# Patient Record
Sex: Male | Born: 1960
Health system: Southern US, Community
[De-identification: ages and names within clinical notes are randomized; demographics above are authoritative.]

## PROBLEM LIST (undated history)

## (undated) DIAGNOSIS — F17200 Nicotine dependence, unspecified, uncomplicated: Secondary | ICD-10-CM

## (undated) DIAGNOSIS — R7302 Impaired glucose tolerance (oral): Secondary | ICD-10-CM

## (undated) DIAGNOSIS — E079 Disorder of thyroid, unspecified: Secondary | ICD-10-CM

## (undated) DIAGNOSIS — M2550 Pain in unspecified joint: Secondary | ICD-10-CM

## (undated) DIAGNOSIS — N289 Disorder of kidney and ureter, unspecified: Secondary | ICD-10-CM

## (undated) DIAGNOSIS — E291 Testicular hypofunction: Secondary | ICD-10-CM

## (undated) DIAGNOSIS — I10 Essential (primary) hypertension: Secondary | ICD-10-CM

## (undated) DIAGNOSIS — G4730 Sleep apnea, unspecified: Secondary | ICD-10-CM

## (undated) DIAGNOSIS — E785 Hyperlipidemia, unspecified: Secondary | ICD-10-CM

## (undated) DIAGNOSIS — E039 Hypothyroidism, unspecified: Secondary | ICD-10-CM

## (undated) DIAGNOSIS — M1990 Unspecified osteoarthritis, unspecified site: Secondary | ICD-10-CM

## (undated) DIAGNOSIS — E042 Nontoxic multinodular goiter: Secondary | ICD-10-CM

## (undated) DIAGNOSIS — K219 Gastro-esophageal reflux disease without esophagitis: Secondary | ICD-10-CM

## (undated) DIAGNOSIS — L989 Disorder of the skin and subcutaneous tissue, unspecified: Secondary | ICD-10-CM

## (undated) DIAGNOSIS — N529 Male erectile dysfunction, unspecified: Secondary | ICD-10-CM

## (undated) HISTORY — PX: MEDIAL PARTIAL KNEE REPLACEMENT: SHX5965

## (undated) HISTORY — DX: Pain in unspecified joint: M25.50

## (undated) HISTORY — DX: Impaired glucose tolerance (oral): R73.02

## (undated) HISTORY — DX: Nicotine dependence, unspecified, uncomplicated: F17.200

## (undated) HISTORY — DX: Unspecified osteoarthritis, unspecified site: M19.90

## (undated) HISTORY — DX: Essential (primary) hypertension: I10

## (undated) HISTORY — DX: Disorder of the skin and subcutaneous tissue, unspecified: L98.9

## (undated) HISTORY — DX: Sleep apnea, unspecified: G47.30

## (undated) HISTORY — DX: Nontoxic multinodular goiter: E04.2

## (undated) HISTORY — DX: Disorder of kidney and ureter, unspecified: N28.9

## (undated) HISTORY — DX: Hyperlipidemia, unspecified: E78.5

## (undated) HISTORY — DX: Disorder of thyroid, unspecified: E07.9

## (undated) HISTORY — DX: Hypothyroidism, unspecified: E03.9

## (undated) HISTORY — DX: Gastro-esophageal reflux disease without esophagitis: K21.9

## (undated) HISTORY — DX: Male erectile dysfunction, unspecified: N52.9

## (undated) HISTORY — DX: Testicular hypofunction: E29.1

---
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
## 1999-07-23 HISTORY — PX: CARDIAC CATHETERIZATION: SHX172

## 2001-07-22 HISTORY — PX: CHOLECYSTECTOMY: SHX55

## 2005-01-21 ENCOUNTER — Emergency Department: Payer: Self-pay | Admitting: Emergency Medicine

## 2007-07-23 DIAGNOSIS — E042 Nontoxic multinodular goiter: Secondary | ICD-10-CM

## 2007-07-23 DIAGNOSIS — G4730 Sleep apnea, unspecified: Secondary | ICD-10-CM

## 2007-07-23 HISTORY — DX: Nontoxic multinodular goiter: E04.2

## 2007-07-23 HISTORY — DX: Sleep apnea, unspecified: G47.30

## 2007-07-23 HISTORY — PX: OTHER SURGICAL HISTORY: SHX169

## 2008-02-26 ENCOUNTER — Ambulatory Visit: Payer: Self-pay | Admitting: Family Medicine

## 2008-06-10 ENCOUNTER — Ambulatory Visit: Payer: Self-pay | Admitting: Family Medicine

## 2008-11-04 ENCOUNTER — Ambulatory Visit: Payer: Self-pay | Admitting: Family Medicine

## 2008-12-23 ENCOUNTER — Ambulatory Visit: Payer: Self-pay | Admitting: Otolaryngology

## 2011-08-23 DIAGNOSIS — E291 Testicular hypofunction: Secondary | ICD-10-CM

## 2011-08-23 HISTORY — DX: Testicular hypofunction: E29.1

## 2012-08-01 ENCOUNTER — Other Ambulatory Visit: Payer: Self-pay | Admitting: Family Medicine

## 2012-08-03 ENCOUNTER — Encounter: Payer: Self-pay | Admitting: Family Medicine

## 2012-08-03 ENCOUNTER — Ambulatory Visit (INDEPENDENT_AMBULATORY_CARE_PROVIDER_SITE_OTHER): Payer: 59 | Admitting: Family Medicine

## 2012-08-03 VITALS — BP 146/100 | HR 67 | Temp 98.5°F | Resp 16 | Ht 73.0 in | Wt 218.8 lb

## 2012-08-03 DIAGNOSIS — Z0000 Encounter for general adult medical examination without abnormal findings: Secondary | ICD-10-CM

## 2012-08-03 DIAGNOSIS — E291 Testicular hypofunction: Secondary | ICD-10-CM | POA: Insufficient documentation

## 2012-08-03 DIAGNOSIS — F430 Acute stress reaction: Secondary | ICD-10-CM

## 2012-08-03 DIAGNOSIS — E039 Hypothyroidism, unspecified: Secondary | ICD-10-CM

## 2012-08-03 DIAGNOSIS — E785 Hyperlipidemia, unspecified: Secondary | ICD-10-CM | POA: Insufficient documentation

## 2012-08-03 DIAGNOSIS — I10 Essential (primary) hypertension: Secondary | ICD-10-CM

## 2012-08-03 DIAGNOSIS — K219 Gastro-esophageal reflux disease without esophagitis: Secondary | ICD-10-CM

## 2012-08-03 DIAGNOSIS — N529 Male erectile dysfunction, unspecified: Secondary | ICD-10-CM

## 2012-08-03 LAB — CBC WITH DIFFERENTIAL/PLATELET
Eosinophils Relative: 5 % (ref 0–5)
Lymphocytes Relative: 19 % (ref 12–46)
Lymphs Abs: 1.3 10*3/uL (ref 0.7–4.0)
MCV: 88.7 fL (ref 78.0–100.0)
Neutro Abs: 4.6 10*3/uL (ref 1.7–7.7)
Neutrophils Relative %: 69 % (ref 43–77)
Platelets: 310 10*3/uL (ref 150–400)
RBC: 4.79 MIL/uL (ref 4.22–5.81)
WBC: 6.7 10*3/uL (ref 4.0–10.5)

## 2012-08-03 LAB — COMPREHENSIVE METABOLIC PANEL
Albumin: 4.9 g/dL (ref 3.5–5.2)
Alkaline Phosphatase: 41 U/L (ref 39–117)
CO2: 23 mEq/L (ref 19–32)
Calcium: 9.7 mg/dL (ref 8.4–10.5)
Chloride: 106 mEq/L (ref 96–112)
Glucose, Bld: 105 mg/dL — ABNORMAL HIGH (ref 70–99)
Potassium: 4.3 mEq/L (ref 3.5–5.3)
Sodium: 139 mEq/L (ref 135–145)
Total Protein: 6.9 g/dL (ref 6.0–8.3)

## 2012-08-03 LAB — POCT UA - MICROSCOPIC ONLY
Bacteria, U Microscopic: NEGATIVE
Casts, Ur, LPF, POC: NEGATIVE

## 2012-08-03 LAB — LIPID PANEL
Cholesterol: 154 mg/dL (ref 0–200)
Triglycerides: 113 mg/dL (ref <150)

## 2012-08-03 LAB — POCT URINALYSIS DIPSTICK
Glucose, UA: NEGATIVE
Spec Grav, UA: 1.02
Urobilinogen, UA: 0.2

## 2012-08-03 LAB — VITAMIN B12: Vitamin B-12: 794 pg/mL (ref 211–911)

## 2012-08-03 LAB — IFOBT (OCCULT BLOOD): IFOBT: NEGATIVE

## 2012-08-03 LAB — POCT GLYCOSYLATED HEMOGLOBIN (HGB A1C): Hemoglobin A1C: 5.9

## 2012-08-03 LAB — CK: Total CK: 161 U/L (ref 7–232)

## 2012-08-03 MED ORDER — AMLODIPINE BESYLATE 10 MG PO TABS: 10.0000 mg | ORAL_TABLET | Freq: Every day | ORAL | Status: DC

## 2012-08-03 NOTE — Progress Notes (Signed)
  Subjective:    Patient ID: Francisco Gallagher, male    DOB: 05/30/1961, 52 y.o.   MRN: [id number]  HPI    Review of Systems  Constitutional: Positive for fatigue.  HENT: Positive for tinnitus.   Eyes: Negative.   Respiratory: Negative.   Cardiovascular: Negative.   Gastrointestinal: Negative.   Genitourinary: Negative.   Musculoskeletal: Positive for arthralgias.  Skin: Negative.   Neurological: Negative.   Hematological: Negative.   Psychiatric/Behavioral: Negative.        Objective:   Physical Exam        Assessment & Plan:

## 2012-08-03 NOTE — Progress Notes (Signed)
[address]   [place], [place]  [address]   [phone]  Subjective:    Patient ID: Francisco Gallagher, male    DOB: 04/20/1961, 52 y.o.   MRN: [id number]  HPIThis 51 y.o. male presents to establish care and for CPE.    Last physical 07/2011.   Colonoscopy never. TDAP 01/18/2008. Influenza never. Eye exam 2013; +glasses; no cataracts or glaucoma. Dental exam 2013; overdue.  Other providers:  [facility]; Derm:  [facility].  ENT:  [facility].  HTN:  New onset in 06/2012. Called office and no availabilities so went to [facility].  Started on Amlodopine 5mg  daily on 06/29/12.  Blood pressures remained elevated so returned one week later; Amlodipine increased to 10mg  on 07/06/12.  Blood pressures running 130-140/80s at home.  Major personal stressors; girlfriend diagnosed with breast cancer one year ago; has been undergoing intensive treatment for past year; labs in 06/2012 with recurrent disease.  Given 6-9 months to live.  Patient has been paying for breast cancer treatment; has paid $20,000 thus far.  Mother with gambling problem and writing bad checks; pt covering her debt.  Pt also paying bills.  Not exercising.  Lots of stressors.  Job pays well.  Sleeping 5 hours per night which is patient's baseline.   Review of Systems  Constitutional: Negative for fever, chills, diaphoresis, activity change, appetite change, fatigue and unexpected weight change.  HENT: Positive for tinnitus. Negative for hearing loss, ear pain, nosebleeds, congestion, sore throat, facial swelling, rhinorrhea, sneezing, drooling, mouth sores, trouble swallowing, neck pain, neck stiffness, dental problem, voice change, postnasal drip, sinus pressure and ear discharge.   Eyes: Negative for photophobia, pain, discharge, redness, itching and visual disturbance.  Respiratory: Negative for apnea, cough, choking, chest tightness, shortness of breath, wheezing and stridor.   Cardiovascular: Negative for  chest pain, palpitations and leg swelling.  Gastrointestinal: Negative for nausea, vomiting, abdominal pain, diarrhea, constipation, blood in stool, abdominal distention, anal bleeding and rectal pain.  Genitourinary: Negative for dysuria, urgency, frequency, hematuria, flank pain, decreased urine volume, discharge, penile swelling, scrotal swelling, enuresis, difficulty urinating, genital sores, penile pain and testicular pain.  Musculoskeletal: Positive for myalgias and arthralgias. Negative for back pain, joint swelling and gait problem.  Skin: Positive for color change and wound. Negative for pallor and rash.  Neurological: Negative for dizziness, tremors, seizures, syncope, facial asymmetry, speech difficulty, weakness, light-headedness, numbness and headaches.  Hematological: Negative for adenopathy. Does not bruise/bleed easily.  Psychiatric/Behavioral: Negative for suicidal ideas, hallucinations, behavioral problems, confusion, sleep disturbance, self-injury, dysphoric mood, decreased concentration and agitation. The patient is not nervous/anxious and is not hyperactive.     Past Medical History  Diagnosis Date  . Thyroid disease     per pt low thyroid  . Hyperlipidemia   . Hypertension   . Arthritis   . GERD (gastroesophageal reflux disease)   . Erectile dysfunction     Cialis PRN  . Hypogonadism male 08/23/2011    s/p urology consult [facility]; no treatment indicated due to potential of pregnancy.  . Glucose intolerance (impaired glucose tolerance)   . Renal insufficiency   . Multinodular goiter 07/23/2007    thyroid u/s: multinodular goiter; s/p ENT consult/[facility]:  Rx for Synthroid.    Past Surgical History  Procedure Date  . Cholecystectomy 2003  . Sleep study 07/23/2007  . Cardiac catheterization 07/23/1999    negative.    Prior to Admission medications   Medication Sig Start Date End  Date Taking? Authorizing Provider  amLODipine (NORVASC) 10 MG tablet Take 1  tablet (10 mg total) by mouth daily. 08/03/12  Yes Ethelda Chick, MD  fenofibrate 160 MG tablet TAKE 1 TABLET EVERY DAY 08/01/12   Nelva Nay, PA-C    Allergies  Allergen Reactions  . Penicillins Rash    History   Social History  . Marital Status: Single    Spouse Name: N/A    Number of Children: N/A  . Years of Education: N/A   Occupational History  . Not on file.   Social History Main Topics  . Smoking status: Never Smoker   . Smokeless tobacco: Current User     Comment: PATIENT CHEWS TOBACCO  . 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 Drug Use: No  . Sexually Active: Yes   Other Topics Concern  . Not on file   Social History Narrative   Marital status: single; dating same male x 5 years; girlfriend diagnosed with Breast Cancer 2013.   Children: none   Lives: with parents   Employment:  Works at [employer] in [employer] x 25 years; happy   Tobacco:  Chews tobacco x 23 years   Alcohol:  5 times per year at [employer]   Drugs:  None   Exercise: sporadic   Seatbelt:  50% of time   Guns: loaded secured guns in home.    Sexual activity: sexually active; no STDs; total sexual partners < 10.    Family History  Problem Relation Age of Onset  . Arthritis Mother   . Hypertension Mother   . COPD Mother   . Depression Mother   . Fibromyalgia Mother   . Gout Mother   . Kidney disease Mother   . Hypertension Father   . Benign prostatic hyperplasia Father   . Rosacea Father   . Arthritis Brother        Objective:   Physical Exam  Nursing note and vitals reviewed. Constitutional: He is oriented to person, place, and time. He appears well-developed and well-nourished. No distress.  HENT:  Head: Normocephalic and atraumatic.  Right Ear: External ear normal.  Left Ear: External ear normal.  Nose: Nose normal.  Mouth/Throat: Oropharynx is clear and moist.  Eyes: Conjunctivae normal and EOM are normal. Pupils are equal, round, and reactive  to light.  Neck: Normal range of motion. Neck supple. No JVD present. No thyromegaly present.  Cardiovascular: Normal rate, regular rhythm, normal heart sounds and intact distal pulses.  Exam reveals no gallop and no friction rub.   No murmur heard. Pulmonary/Chest: Effort normal and breath sounds normal. He has no wheezes. He has no rales.  Abdominal: Soft. Bowel sounds are normal. He exhibits no distension and no mass. There is no tenderness. There is no rebound and no guarding. Hernia confirmed negative in the right inguinal area and confirmed negative in the left inguinal area.  Genitourinary: Rectum normal, testes normal and penis normal. Guaiac negative stool. Prostate is enlarged. Prostate is not tender. Right testis shows no mass, no swelling and no tenderness. Left testis shows no mass, no swelling and no tenderness. Circumcised. No penile tenderness.  Musculoskeletal: Normal range of motion. He exhibits no edema.       Right shoulder: Normal.       Left shoulder: Normal.       Cervical back: Normal.  Lymphadenopathy:    He has no cervical adenopathy.  Right: No inguinal adenopathy present.       Left: No inguinal adenopathy present.  Neurological: He is alert and oriented to person, place, and time. No cranial nerve deficit. He exhibits normal muscle tone. Coordination normal.  Skin: Skin is warm and dry. He is not diaphoretic.       Scattered nevi torso; +maculopapular lesion L upper arm.  Psychiatric: He has a normal mood and affect. His behavior is normal. Judgment and thought content normal.   Results for orders placed in visit on 08/03/12  POCT UA - MICROSCOPIC ONLY      Component Value Range   WBC, Ur, HPF, POC neg     RBC, urine, microscopic 0-2     Bacteria, U Microscopic neg     Mucus, UA trace     Epithelial cells, urine per micros 0-1     Crystals, Ur, HPF, POC neg     Casts, Ur, LPF, POC neg     Yeast, UA neg    POCT URINALYSIS DIPSTICK      Component Value  Range   Color, UA yellow     Clarity, UA clear     Glucose, UA neg     Bilirubin, UA neg     Ketones, UA neg     Spec Grav, UA 1.020     Blood, UA trace     pH, UA 7.0     Protein, UA trace     Urobilinogen, UA 0.2     Nitrite, UA neg     Leukocytes, UA Negative    POCT GLYCOSYLATED HEMOGLOBIN (HGB A1C)      Component Value Range   Hemoglobin A1C 5.9    IFOBT (OCCULT BLOOD)      Component Value Range   IFOBT Negative         Assessment & Plan:   1. Routine general medical examination at a health care facility  POCT UA - Microscopic Only, POCT urinalysis dipstick, CBC with Differential, CK, POCT glycosylated hemoglobin (Hb A1C), Lipid panel, TSH, PSA, Vitamin B12, Vitamin D 25 hydroxy, T4, Free, Comprehensive metabolic panel, EKG 12-Lead, IFOBT POC (occult bld, rslt in office)  2. Routine general medical examination at a health care facility Active POCT UA - Microscopic Only, POCT urinalysis dipstick, CBC with Differential, CK, POCT glycosylated hemoglobin (Hb A1C), Lipid panel, TSH, PSA, Vitamin B12, Vitamin D 25 hydroxy, T4, Free, Comprehensive metabolic panel, EKG 12-Lead, IFOBT POC (occult bld, rslt in office)   patient is here for a complete physical need this test added onto the rest of his test  3. Essential hypertension, benign    4. Stress reaction

## 2012-08-03 NOTE — Assessment & Plan Note (Signed)
Stable; continue Cialis PRN.

## 2012-08-03 NOTE — Assessment & Plan Note (Signed)
Controlled; obtain labs.  Continue current medications.

## 2012-08-03 NOTE — Assessment & Plan Note (Signed)
New. Counseling provided; coping well at this time with stressors.

## 2012-08-03 NOTE — Patient Instructions (Addendum)
1. 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 CHECK BLOOD PRESSURE ONCE DAILY.

## 2012-08-03 NOTE — Assessment & Plan Note (Signed)
Controlled; using medication PRN.

## 2012-08-03 NOTE — Assessment & Plan Note (Signed)
New.  Moderate control; no changes to management made at visit.  Obtain u/a, EKG.

## 2012-08-03 NOTE — Assessment & Plan Note (Signed)
Anticipatory guidance --- exercise, stress management.  Immunizations discussed; declined flu vaccine; TDAP UTD.  Obtain labs.  Discussed colon cancer screening; declined referral for colonoscopy; hemosure negative.

## 2012-08-03 NOTE — Assessment & Plan Note (Signed)
Controlled; obtain labs; continue current medications. 

## 2012-08-14 ENCOUNTER — Other Ambulatory Visit: Payer: Self-pay | Admitting: Radiology

## 2012-08-14 MED ORDER — LEVOTHYROXINE SODIUM 100 MCG PO TABS: 100.0000 ug | ORAL_TABLET | Freq: Every day | ORAL | Status: DC

## 2012-08-14 NOTE — Telephone Encounter (Signed)
Pharmacy called about Synthroid, patient was recently here for physical this is sent in for patient

## 2012-08-22 HISTORY — PX: OTHER SURGICAL HISTORY: SHX169

## 2012-08-27 ENCOUNTER — Encounter: Payer: Self-pay | Admitting: *Deleted

## 2012-09-01 ENCOUNTER — Encounter: Payer: Self-pay | Admitting: Family Medicine

## 2012-10-30 ENCOUNTER — Telehealth: Payer: Self-pay

## 2012-10-30 NOTE — Telephone Encounter (Signed)
Leg swelling is a known and common side effect of Norvasc.  Is this new?  It looks like he's been on Norvasc 10 mg for awhile now.  There are certainly plenty of other antihypertensives that we can try.  I would advise him to RTC so we can discuss

## 2012-10-30 NOTE — Telephone Encounter (Signed)
Patient advised.

## 2012-10-30 NOTE — Telephone Encounter (Signed)
Called him to advise, has noticed swelling for past 2 months, he notices a line from his socks, states not severe. He has appt scheduled on 11/11/12 wants to know if okay to wait until this appt.

## 2012-10-30 NOTE — Telephone Encounter (Signed)
Yes, if swelling not severe, OK to wait to discuss at appointment on 11/11/12.  Swelling definitely can be due to Amlodipine.

## 2012-10-30 NOTE — Telephone Encounter (Signed)
Legs swelling, on norvasc please advise.

## 2012-10-30 NOTE — Telephone Encounter (Signed)
PT STATES THE HBP MEDS DR Katrinka Blazing HAVE HIM ON IS MAKING HIS LEGS SWELL AND WANTED HER TO KNOW. DIDN'T KNOW IF SHE MAY WANT TO CHANGE IT PLEASE CALL PT AT [phone]   [facility] ON [address] IN [place]

## 2012-11-11 ENCOUNTER — Encounter: Payer: Self-pay | Admitting: Family Medicine

## 2012-11-11 ENCOUNTER — Ambulatory Visit: Payer: 59

## 2012-11-11 ENCOUNTER — Ambulatory Visit (INDEPENDENT_AMBULATORY_CARE_PROVIDER_SITE_OTHER): Payer: 59 | Admitting: Family Medicine

## 2012-11-11 VITALS — BP 146/100 | HR 65 | Temp 98.2°F | Resp 18 | Wt 217.0 lb

## 2012-11-11 DIAGNOSIS — M25512 Pain in left shoulder: Secondary | ICD-10-CM

## 2012-11-11 DIAGNOSIS — M2550 Pain in unspecified joint: Secondary | ICD-10-CM

## 2012-11-11 DIAGNOSIS — I10 Essential (primary) hypertension: Secondary | ICD-10-CM

## 2012-11-11 DIAGNOSIS — E039 Hypothyroidism, unspecified: Secondary | ICD-10-CM

## 2012-11-11 DIAGNOSIS — M7989 Other specified soft tissue disorders: Secondary | ICD-10-CM

## 2012-11-11 DIAGNOSIS — M25519 Pain in unspecified shoulder: Secondary | ICD-10-CM

## 2012-11-11 LAB — POCT URINALYSIS DIPSTICK
Bilirubin, UA: NEGATIVE
Ketones, UA: NEGATIVE
Leukocytes, UA: NEGATIVE
Spec Grav, UA: 1.02
pH, UA: 7

## 2012-11-11 LAB — CBC
HCT: 40.2 % (ref 39.0–52.0)
Hemoglobin: 14 g/dL (ref 13.0–17.0)
MCHC: 34.8 g/dL (ref 30.0–36.0)
MCV: 87.4 fL (ref 78.0–100.0)

## 2012-11-11 LAB — COMPREHENSIVE METABOLIC PANEL
Albumin: 5 g/dL (ref 3.5–5.2)
Alkaline Phosphatase: 56 U/L (ref 39–117)
BUN: 17 mg/dL (ref 6–23)
Calcium: 9.9 mg/dL (ref 8.4–10.5)
Chloride: 104 mEq/L (ref 96–112)
Creat: 1.14 mg/dL (ref 0.50–1.35)
Glucose, Bld: 90 mg/dL (ref 70–99)
Potassium: 4.3 mEq/L (ref 3.5–5.3)

## 2012-11-11 LAB — POCT UA - MICROSCOPIC ONLY

## 2012-11-11 MED ORDER — MELOXICAM 15 MG PO TABS: 15.0000 mg | ORAL_TABLET | Freq: Every day | ORAL | Status: DC

## 2012-11-11 MED ORDER — AMLODIPINE BESYLATE 10 MG PO TABS: 5.0000 mg | ORAL_TABLET | Freq: Every day | ORAL | Status: DC

## 2012-11-11 MED ORDER — VALSARTAN 80 MG PO TABS: 80.0000 mg | ORAL_TABLET | Freq: Every day | ORAL | Status: DC

## 2012-11-11 NOTE — Patient Instructions (Addendum)
Impingement Syndrome, Rotator Cuff, Bursitis with Rehab Impingement syndrome is a condition that involves inflammation of the tendons of the rotator cuff and the subacromial bursa, that causes pain in the shoulder. The rotator cuff consists of four tendons and muscles that control much of the shoulder and upper arm function. The subacromial bursa is a fluid filled sac that helps reduce friction between the rotator cuff and one of the bones of the shoulder (acromion). Impingement syndrome is usually an overuse injury that causes swelling of the bursa (bursitis), swelling of the tendon (tendonitis), and/or a tear of the tendon (strain). Strains are classified into three categories. Grade 1 strains cause pain, but the tendon is not lengthened. Grade 2 strains include a lengthened ligament, due to the ligament being stretched or partially ruptured. With grade 2 strains there is still function, although the function may be decreased. Grade 3 strains include a complete tear of the tendon or muscle, and function is usually impaired. SYMPTOMS   Pain around the shoulder, often at the outer portion of the upper arm.  Pain that gets worse with shoulder function, especially when reaching overhead or lifting.  Sometimes, aching when not using the arm.  Pain that wakes you up at night.  Sometimes, tenderness, swelling, warmth, or redness over the affected area.  Loss of strength.  Limited motion of the shoulder, especially reaching behind the back (to the back pocket or to unhook bra) or across your body.  Crackling sound (crepitation) when moving the arm.  Biceps tendon pain and inflammation (in the front of the shoulder). Worse when bending the elbow or lifting. CAUSES  Impingement syndrome is often an overuse injury, in which chronic (repetitive) motions cause the tendons or bursa to become inflamed. A strain occurs when a force is paced on the tendon or muscle that is greater than it can withstand.  Common mechanisms of injury include: Stress from sudden increase in duration, frequency, or intensity of training.  Direct hit (trauma) to the shoulder.  Aging, erosion of the tendon with normal use.  Bony bump on shoulder (acromial spur). RISK INCREASES WITH:  Contact sports (football, wrestling, boxing).  Throwing sports (baseball, tennis, volleyball).  Weightlifting and bodybuilding.  Heavy labor.  Previous injury to the rotator cuff, including impingement.  Poor shoulder strength and flexibility.  Failure to warm up properly before activity.  Inadequate protective equipment.  Old age.  Bony bump on shoulder (acromial spur). PREVENTION   Warm up and stretch properly before activity.  Allow for adequate recovery between workouts.  Maintain physical fitness:  Strength, flexibility, and endurance.  Cardiovascular fitness.  Learn and use proper exercise technique. PROGNOSIS  If treated properly, impingement syndrome usually goes away within 6 weeks. Sometimes surgery is required.  RELATED COMPLICATIONS   Longer healing time if not properly treated, or if not given enough time to heal.  Recurring symptoms, that result in a chronic condition.  Shoulder stiffness, frozen shoulder, or loss of motion.  Rotator cuff tendon tear.  Recurring symptoms, especially if activity is resumed too soon, with overuse, with a direct blow, or when using poor technique. TREATMENT  Treatment first involves the use of ice and medicine, to reduce pain and inflammation. The use of strengthening and stretching exercises may help reduce pain with activity. These exercises may be performed at home or with a therapist. If non-surgical treatment is unsuccessful after more than 6 months, surgery may be advised. After surgery and rehabilitation, activity is usually possible in 3 months.  MEDICATION  If pain medicine is needed, nonsteroidal anti-inflammatory medicines (aspirin and  ibuprofen), or other minor pain relievers (acetaminophen), are often advised.  Do not take pain medicine for 7 days before surgery.  Prescription pain relievers may be given, if your caregiver thinks they are needed. Use only as directed and only as much as you need.  Corticosteroid injections may be given by your caregiver. These injections should be reserved for the most serious cases, because they may only be given a certain number of times. HEAT AND COLD  Cold treatment (icing) should be applied for 10 to 15 minutes every 2 to 3 hours for inflammation and pain, and immediately after activity that aggravates your symptoms. Use ice packs or an ice massage.  Heat treatment may be used before performing stretching and strengthening activities prescribed by your caregiver, physical therapist, or athletic trainer. Use a heat pack or a warm water soak. SEEK MEDICAL CARE IF:   Symptoms get worse or do not improve in 4 to 6 weeks, despite treatment.  New, unexplained symptoms develop. (Drugs used in treatment may produce side effects.) EXERCISES  RANGE OF MOTION (ROM) AND STRETCHING EXERCISES - Impingement Syndrome (Rotator Cuff  Tendinitis, Bursitis) These exercises may help you when beginning to rehabilitate your injury. Your symptoms may go away with or without further involvement from your physician, physical therapist or athletic trainer. While completing these exercises, remember:   Restoring tissue flexibility helps normal motion to return to the joints. This allows healthier, less painful movement and activity.  An effective stretch should be held for at least 30 seconds.  A stretch should never be painful. You should only feel a gentle lengthening or release in the stretched tissue. STRETCH  Flexion, Standing  Stand with good posture. With an underhand grip on your right / left hand, and an overhand grip on the opposite hand, grasp a broomstick or cane so that your hands are a little  more than shoulder width apart.  Keeping your right / left elbow straight and shoulder muscles relaxed, push the stick with your opposite hand, to raise your right / left arm in front of your body and then overhead. Raise your arm until you feel a stretch in your right / left shoulder, but before you have increased shoulder pain.  Try to avoid shrugging your right / left shoulder as your arm rises, by keeping your shoulder blade tucked down and toward your mid-back spine. Hold for __________ seconds.  Slowly return to the starting position. Repeat __________ times. Complete this exercise __________ times per day. STRETCH  Abduction, Supine  Lie on your back. With an underhand grip on your right / left hand and an overhand grip on the opposite hand, grasp a broomstick or cane so that your hands are a little more than shoulder width apart.  Keeping your right / left elbow straight and your shoulder muscles relaxed, push the stick with your opposite hand, to raise your right / left arm out to the side of your body and then overhead. Raise your arm until you feel a stretch in your right / left shoulder, but before you have increased shoulder pain.  Try to avoid shrugging your right / left shoulder as your arm rises, by keeping your shoulder blade tucked down and toward your mid-back spine. Hold for __________ seconds.  Slowly return to the starting position. Repeat __________ times. Complete this exercise __________ times per day. ROM  Flexion, Active-Assisted  Lie on your back.  You may bend your knees for comfort.  Grasp a broomstick or cane so your hands are about shoulder width apart. Your right / left hand should grip the end of the stick, so that your hand is positioned "thumbs-up," as if you were about to shake hands.  Using your healthy arm to lead, raise your right / left arm overhead, until you feel a gentle stretch in your shoulder. Hold for __________ seconds.  Use the stick to  assist in returning your right / left arm to its starting position. Repeat __________ times. Complete this exercise __________ times per day.  ROM - Internal Rotation, Supine   Lie on your back on a firm surface. Place your right / left elbow about 60 degrees away from your side. Elevate your elbow with a folded towel, so that the elbow and shoulder are the same height.  Using a broomstick or cane and your strong arm, pull your right / left hand toward your body until you feel a gentle stretch, but no increase in your shoulder pain. Keep your shoulder and elbow in place throughout the exercise.  Hold for __________ seconds. Slowly return to the starting position. Repeat __________ times. Complete this exercise __________ times per day. STRETCH - Internal Rotation  Place your right / left hand behind your back, palm up.  Throw a towel or belt over your opposite shoulder. Grasp the towel with your right / left hand.  While keeping an upright posture, gently pull up on the towel, until you feel a stretch in the front of your right / left shoulder.  Avoid shrugging your right / left shoulder as your arm rises, by keeping your shoulder blade tucked down and toward your mid-back spine.  Hold for __________ seconds. Release the stretch, by lowering your healthy hand. Repeat __________ times. Complete this exercise __________ times per day. ROM - Internal Rotation   Using an underhand grip, grasp a stick behind your back with both hands.  While standing upright with good posture, slide the stick up your back until you feel a mild stretch in the front of your shoulder.  Hold for __________ seconds. Slowly return to your starting position. Repeat __________ times. Complete this exercise __________ times per day.  STRETCH  Posterior Shoulder Capsule   Stand or sit with good posture. Grasp your right / left elbow and draw it across your chest, keeping it at the same height as your  shoulder.  Pull your elbow, so your upper arm comes in closer to your chest. Pull until you feel a gentle stretch in the back of your shoulder.  Hold for __________ seconds. Repeat __________ times. Complete this exercise __________ times per day. STRENGTHENING EXERCISES - Impingement Syndrome (Rotator Cuff Tendinitis, Bursitis) These exercises may help you when beginning to rehabilitate your injury. They may resolve your symptoms with or without further involvement from your physician, physical therapist or athletic trainer. While completing these exercises, remember:  Muscles can gain both the endurance and the strength needed for everyday activities through controlled exercises.  Complete these exercises as instructed by your physician, physical therapist or athletic trainer. Increase the resistance and repetitions only as guided.  You may experience muscle soreness or fatigue, but the pain or discomfort you are trying to eliminate should never worsen during these exercises. If this pain does get worse, stop and make sure you are following the directions exactly. If the pain is still present after adjustments, discontinue the exercise until you can discuss  the trouble with your clinician.  During your recovery, avoid activity or exercises which involve actions that place your injured hand or elbow above your head or behind your back or head. These positions stress the tissues which you are trying to heal. STRENGTH - Scapular Depression and Adduction   With good posture, sit on a firm chair. Support your arms in front of you, with pillows, arm rests, or on a table top. Have your elbows in line with the sides of your body.  Gently draw your shoulder blades down and toward your mid-back spine. Gradually increase the tension, without tensing the muscles along the top of your shoulders and the back of your neck.  Hold for __________ seconds. Slowly release the tension and relax your muscles  completely before starting the next repetition.  After you have practiced this exercise, remove the arm support and complete the exercise in standing as well as sitting position. Repeat __________ times. Complete this exercise __________ times per day.  STRENGTH - Shoulder Abductors, Isometric  With good posture, stand or sit about 4-6 inches from a wall, with your right / left side facing the wall.  Bend your right / left elbow. Gently press your right / left elbow into the wall. Increase the pressure gradually, until you are pressing as hard as you can, without shrugging your shoulder or increasing any shoulder discomfort.  Hold for __________ seconds.  Release the tension slowly. Relax your shoulder muscles completely before you begin the next repetition. Repeat __________ times. Complete this exercise __________ times per day.  STRENGTH - External Rotators, Isometric  Keep your right / left elbow at your side and bend it 90 degrees.  Step into a door frame so that the outside of your right / left wrist can press against the door frame without your upper arm leaving your side.  Gently press your right / left wrist into the door frame, as if you were trying to swing the back of your hand away from your stomach. Gradually increase the tension, until you are pressing as hard as you can, without shrugging your shoulder or increasing any shoulder discomfort.  Hold for __________ seconds.  Release the tension slowly. Relax your shoulder muscles completely before you begin the next repetition. Repeat __________ times. Complete this exercise __________ times per day.  STRENGTH - Supraspinatus   Stand or sit with good posture. Grasp a __________ weight, or an exercise band or tubing, so that your hand is "thumbs-up," like you are shaking hands.  Slowly lift your right / left arm in a "V" away from your thigh, diagonally into the space between your side and straight ahead. Lift your hand to  shoulder height or as far as you can, without increasing any shoulder pain. At first, many people do not lift their hands above shoulder height.  Avoid shrugging your right / left shoulder as your arm rises, by keeping your shoulder blade tucked down and toward your mid-back spine.  Hold for __________ seconds. Control the descent of your hand, as you slowly return to your starting position. Repeat __________ times. Complete this exercise __________ times per day.  STRENGTH - External Rotators  Secure a rubber exercise band or tubing to a fixed object (table, pole) so that it is at the same height as your right / left elbow when you are standing or sitting on a firm surface.  Stand or sit so that the secured exercise band is at your uninjured side.  Bend   your right / left elbow 90 degrees. Place a folded towel or small pillow under your right / left arm, so that your elbow is a few inches away from your side.  Keeping the tension on the exercise band, pull it away from your body, as if pivoting on your elbow. Be sure to keep your body steady, so that the movement is coming only from your rotating shoulder.  Hold for __________ seconds. Release the tension in a controlled manner, as you return to the starting position. Repeat __________ times. Complete this exercise __________ times per day.  STRENGTH - Internal Rotators   Secure a rubber exercise band or tubing to a fixed object (table, pole) so that it is at the same height as your right / left elbow when you are standing or sitting on a firm surface.  Stand or sit so that the secured exercise band is at your right / left side.  Bend your elbow 90 degrees. Place a folded towel or small pillow under your right / left arm so that your elbow is a few inches away from your side.  Keeping the tension on the exercise band, pull it across your body, toward your stomach. Be sure to keep your body steady, so that the movement is coming only from  your rotating shoulder.  Hold for __________ seconds. Release the tension in a controlled manner, as you return to the starting position. Repeat __________ times. Complete this exercise __________ times per day.  STRENGTH - Scapular Protractors, Standing   Stand arms length away from a wall. Place your hands on the wall, keeping your elbows straight.  Begin by dropping your shoulder blades down and toward your mid-back spine.  To strengthen your protractors, keep your shoulder blades down, but slide them forward on your rib cage. It will feel as if you are lifting the back of your rib cage away from the wall. This is a subtle motion and can be challenging to complete. Ask your caregiver for further instruction, if you are not sure you are doing the exercise correctly.  Hold for __________ seconds. Slowly return to the starting position, resting the muscles completely before starting the next repetition. Repeat __________ times. Complete this exercise __________ times per day. STRENGTH - Scapular Protractors, Supine  Lie on your back on a firm surface. Extend your right / left arm straight into the air while holding a __________ weight in your hand.  Keeping your head and back in place, lift your shoulder off the floor.  Hold for __________ seconds. Slowly return to the starting position, and allow your muscles to relax completely before starting the next repetition. Repeat __________ times. Complete this exercise __________ times per day. STRENGTH - Scapular Protractors, Quadruped  Get onto your hands and knees, with your shoulders directly over your hands (or as close as you can be, comfortably).  Keeping your elbows locked, lift the back of your rib cage up into your shoulder blades, so your mid-back rounds out. Keep your neck muscles relaxed.  Hold this position for __________ seconds. Slowly return to the starting position and allow your muscles to relax completely before starting the  next repetition. Repeat __________ times. Complete this exercise __________ times per day.  STRENGTH - Scapular Retractors  Secure a rubber exercise band or tubing to a fixed object (table, pole), so that it is at the height of your shoulders when you are either standing, or sitting on a firm armless chair.  With a   palm down grip, grasp an end of the band in each hand. Straighten your elbows and lift your hands straight in front of you, at shoulder height. Step back, away from the secured end of the band, until it becomes tense.  Squeezing your shoulder blades together, draw your elbows back toward your sides, as you bend them. Keep your upper arms lifted away from your body throughout the exercise.  Hold for __________ seconds. Slowly ease the tension on the band, as you reverse the directions and return to the starting position. Repeat __________ times. Complete this exercise __________ times per day. STRENGTH - Shoulder Extensors   Secure a rubber exercise band or tubing to a fixed object (table, pole) so that it is at the height of your shoulders when you are either standing, or sitting on a firm armless chair.  With a thumbs-up grip, grasp an end of the band in each hand. Straighten your elbows and lift your hands straight in front of you, at shoulder height. Step back, away from the secured end of the band, until it becomes tense.  Squeezing your shoulder blades together, pull your hands down to the sides of your thighs. Do not allow your hands to go behind you.  Hold for __________ seconds. Slowly ease the tension on the band, as you reverse the directions and return to the starting position. Repeat __________ times. Complete this exercise __________ times per day.  STRENGTH - Scapular Retractors and External Rotators   Secure a rubber exercise band or tubing to a fixed object (table, pole) so that it is at the height as your shoulders, when you are either standing, or sitting on a  firm armless chair.  With a palm down grip, grasp an end of the band in each hand. Bend your elbows 90 degrees and lift your elbows to shoulder height, at your sides. Step back, away from the secured end of the band, until it becomes tense.  Squeezing your shoulder blades together, rotate your shoulders so that your upper arms and elbows remain stationary, but your fists travel upward to head height.  Hold for __________ seconds. Slowly ease the tension on the band, as you reverse the directions and return to the starting position. Repeat __________ times. Complete this exercise __________ times per day.  STRENGTH - Scapular Retractors and External Rotators, Rowing   Secure a rubber exercise band or tubing to a fixed object (table, pole) so that it is at the height of your shoulders, when you are either standing, or sitting on a firm armless chair.  With a palm down grip, grasp an end of the band in each hand. Straighten your elbows and lift your hands straight in front of you, at shoulder height. Step back, away from the secured end of the band, until it becomes tense.  Step 1: Squeeze your shoulder blades together. Bending your elbows, draw your hands to your chest, as if you are rowing a boat. At the end of this motion, your hands and elbow should be at shoulder height and your elbows should be out to your sides.  Step 2: Rotate your shoulders, to raise your hands above your head. Your forearms should be vertical and your upper arms should be horizontal.  Hold for __________ seconds. Slowly ease the tension on the band, as you reverse the directions and return to the starting position. Repeat __________ times. Complete this exercise __________ times per day.  STRENGTH  Scapular Depressors  Find a sturdy chair  without wheels, such as a dining room chair.  Keeping your feet on the floor, and your hands on the chair arms, lift your bottom up from the seat, and lock your elbows.  Keeping  your elbows straight, allow gravity to pull your body weight down. Your shoulders will rise toward your ears.  Raise your body against gravity by drawing your shoulder blades down your back, shortening the distance between your shoulders and ears. Although your feet should always maintain contact with the floor, your feet should progressively support less body weight, as you get stronger.  Hold for __________ seconds. In a controlled and slow manner, lower your body weight to begin the next repetition. Repeat __________ times. Complete this exercise __________ times per day.  Document Released: 07/08/2005 Document Revised: 09/30/2011 Document Reviewed: 10/20/2008 [facility] [employer] Patient Information 2013 [place], [place].

## 2012-11-11 NOTE — Progress Notes (Signed)
[address]   [place], [place]  [address]   [phone]  Subjective:    Patient ID: Francisco Gallagher, male    DOB: March 22, 1961, 52 y.o.   MRN: [id number]  HPI This 52 y.o. male presents for evaluation of the following:  1. HTN:  Three month follow-up for elevated blood pressure.  Has been suffering with mild swelling in legs; will see sock marks.  Walking and standing all day at work.  When gets home and elevates feet, swelling down.  BP readings running 126-151/80-93.  Stopped Amlodipine for a few days, blood pressure elevated; leg swelling improved slightly.  Today before visit was 143/93.  Not exercising currently; wakes up at 4:30am; when gets home, exhausted.  Goes to bed at 10:00-12:00; not sleeping enough.  Naps on weekends.   S/p sleep study in 2009; no CPAP.  No weight gain in past few years.   Stress improved.  No orthopnea; +DOE with really exerting self; able to work without SOB.  2. Arthralgias:  Feet, ankles, knees.  Stiffness.  First thing in morning; after work and sits down.  Unable to exercise due to stiffness.  No swelling.  Hands are also stiff; knuckles proximal B hands.  S/p consultation by Dr. Reita Chard in [place].  Feet and ankles hurt the most.  Stand on concrete all day; walks on concrete all day. Fingers and feet are the worse.  Mother with fibromyalgia.  L shoulder hurting again; s/p ortho consult ([facility]); prescribed home exercises.  Pain mild but persistent.  3.  Leg swelling: onset since last visit.  Thinks secondary to Amlodipine.  Denies CP/palp/DOE/orthopnea/SOB.  No new medications.  No change in activity level.    Review of Systems  Constitutional: Negative for fever, chills, diaphoresis and fatigue.  Eyes: Negative for photophobia and visual disturbance.  Respiratory: Positive for shortness of breath. Negative for cough.   Cardiovascular: Positive for leg swelling. Negative for chest pain and palpitations.  Musculoskeletal: Positive for joint swelling and  arthralgias.  Skin: Negative for rash.  Neurological: Negative for dizziness, tremors, seizures, syncope, facial asymmetry, speech difficulty, weakness, light-headedness, numbness and headaches.        Past Medical History  Diagnosis Date  . Thyroid disease     per pt low thyroid  . Hyperlipidemia   . Hypertension   . Arthritis   . GERD (gastroesophageal reflux disease)   . Erectile dysfunction     Cialis PRN  . Hypogonadism male 08/23/2011    s/p urology consult [facility]; no treatment indicated due to potential of pregnancy.  . Glucose intolerance (impaired glucose tolerance)   . Renal insufficiency   . Multinodular goiter 07/23/2007    thyroid u/s: multinodular goiter; s/p ENT consult/[facility]:  Rx for Synthroid.  Marland Kitchen Unspecified hypothyroidism   . Tobacco use disorder   . Pain in joint, site unspecified   . Unspecified disorder of skin and subcutaneous tissue   . Mild sleep apnea     Past Surgical History  Procedure Laterality Date  . Cholecystectomy  2003  . Sleep study  07/23/2007  . Cardiac catheterization  07/23/1999    negative.  Gillermo Murdoch cyst  1981    Prior to Admission medications   Medication Sig Start Date End Date Taking? Authorizing Provider  amLODipine (NORVASC) 10 MG tablet Take 1 tablet (10 mg total) by mouth daily. 08/03/12  Yes Ethelda Chick, MD  fenofibrate 160 MG tablet TAKE 1 TABLET EVERY DAY 08/01/12  Yes  Heather M Marte, PA-C  levothyroxine (SYNTHROID, LEVOTHROID) 100 MCG tablet Take 1 tablet (100 mcg total) by mouth daily. 08/14/12  Yes Eleanore E Egan, PA-C  pantoprazole (PROTONIX) 40 MG tablet Take 40 mg by mouth daily.   Yes Historical Provider, MD  tadalafil (CIALIS) 20 MG tablet Take 20 mg by mouth daily as needed.   Yes Historical Provider, MD  valsartan (DIOVAN) 80 MG tablet Take 1 tablet (80 mg total) by mouth daily. 11/11/12   Ethelda Chick, MD    Allergies  Allergen Reactions  . Penicillins Rash    History   Social History  .  Marital Status: Single    Spouse Name: N/A    Number of Children: 0  . Years of Education: N/A   Occupational History  . 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 Smoking status: Never Smoker   . Smokeless tobacco: Current User     Comment: PATIENT CHEWS TOBACCO  20 years  . 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 Drug Use: No  . Sexually Active: Yes   Other Topics Concern  . Not on file   Social History Narrative   Marital status: single; dating same male x 5 years; girlfriend diagnosed with Breast Cancer 2013.      Children: none      Lives: with parents      Employment:  Works at [employer] in [employer] x 25 years; happy      Tobacco:  Chews tobacco x 23 years      Alcohol:  5 times per year at [employer]      Drugs:  None      Exercise: sporadic      Seatbelt:  50% of time      Guns: loaded secured guns in home.       Sexual activity: sexually active; no STDs; total sexual partners < 10.   Smoke alarm and carbon monoxide detector in the home.   Caffeine use: carbonated beverages, moderate amount.    Family History  Problem Relation Age of Onset  . Arthritis Mother   . Hypertension Mother   . COPD Mother   . Depression Mother   . Fibromyalgia Mother   . Gout Mother   . Kidney disease Mother   . Cancer Mother     lung  . Heart disease Mother   . Hypertension Father   . Benign prostatic hyperplasia Father   . Rosacea Father   . Arthritis Brother   . Heart disease Maternal Grandfather   . Hypertension Paternal Grandmother   . Stroke Paternal Grandfather   . Diabetes      Objective:   Physical Exam  Nursing note and vitals reviewed. Constitutional: He is oriented to person, place, and time. He appears well-developed and well-nourished. No distress.  HENT:  Nose: Nose normal.  Mouth/Throat: Oropharynx is clear and moist.  Eyes: Conjunctivae and EOM are normal. Pupils are equal,  round, and reactive to light.  Neck: Normal range of motion. Neck supple. No thyromegaly present.  Cardiovascular: Normal rate, regular rhythm and normal heart sounds.  Exam reveals no gallop and no friction rub.   No murmur heard. Pulmonary/Chest: Effort normal and breath sounds normal. He has no wheezes. He has no rales.  Musculoskeletal:       Left shoulder: He exhibits  pain. He exhibits normal range of motion, no tenderness, no bony tenderness, no spasm, normal pulse and normal strength.       Left hand: He exhibits bony tenderness. He exhibits normal range of motion, no tenderness, normal capillary refill, no deformity and no laceration.  L SHOULDER: PAIN WITH ELEVATION> 90 DEGREES; CROSS OVER POSITIVE; EMPTY CAN NEGATIVE;MOTOR 5/5.  GRIP 5/5. B HANDS: JOINT SWELLING/DEFORMITY PIP, DIP JOINTS SCATTERED AND THROUGHOUT.    Lymphadenopathy:    He has no cervical adenopathy.  Neurological: He is alert and oriented to person, place, and time.  Skin: He is not diaphoretic.  Psychiatric: He has a normal mood and affect. His behavior is normal. Judgment and thought content normal.   Results for orders placed in visit on 11/11/12  POCT URINALYSIS DIPSTICK      Result Value Range   Color, UA yellow     Clarity, UA clear     Glucose, UA neg     Bilirubin, UA neg     Ketones, UA neg     Spec Grav, UA 1.020     Blood, UA trace     pH, UA 7.0     Protein, UA trace     Urobilinogen, UA 1.0     Nitrite, UA neg     Leukocytes, UA Negative      [facility] reading (PRIMARY) by  Dr. Katrinka Blazing.  R HAND:  DEGENERATIVE CHANGES DIP REGIONS.  L SHOULDER: NAD.      Assessment & Plan:  Essential hypertension, benign - Plan: POCT urinalysis dipstick, CBC, Comprehensive metabolic panel, TSH, valsartan (DIOVAN) 80 MG tablet  Unspecified hypothyroidism - Plan: POCT urinalysis dipstick, CBC, Comprehensive metabolic panel, TSH  Pain in shoulder, left - Plan: DG Shoulder Left  Arthralgia - Plan: DG Hand 2 View  Right   1.  HTN: worsening; add Valsartan 80mg  daily; decrease Amlodipine to 10mg  1/2 tablet daily; follow-up in two months; check blood pressure daily. 2.  Pain L shoulder: recurrent; s/p xray in office; rx for Mobic 15mg  provided to take daily for two weeks and then PRN.  Home exercise program provided to perform daily.  Avoid heavy lifting or excessive use of L shoulder. 3. Arthralgias:  New.  B feet, ankles, hands, shoulders.  Obtain labs; obtain hand film R.  Rx for Mobic provided.   4. Leg swelling: New. Very mild in office.  May be secondary to Amlodipine; decrease Amlodipine to 10mg  1/2 tablet daily; obtain labs to rule out secondary causes of swelling.  Meds ordered this encounter  Medications  . valsartan (DIOVAN) 80 MG tablet    Sig: Take 1 tablet (80 mg total) by mouth daily.    Dispense:  30 tablet    Refill:  5  . meloxicam (MOBIC) 15 MG tablet    Sig: Take 1 tablet (15 mg total) by mouth daily.    Dispense:  30 tablet    Refill:  2  . amLODipine (NORVASC) 10 MG tablet    Sig: Take 0.5 tablets (5 mg total) by mouth daily.    Dispense:  30 tablet    Refill:  5

## 2012-11-12 ENCOUNTER — Telehealth: Payer: Self-pay

## 2012-11-12 LAB — RHEUMATOID FACTOR: Rhuematoid fact SerPl-aCnc: <10 IU/mL (ref <=14)

## 2012-11-12 LAB — ANA: Anti Nuclear Antibody(ANA): NEGATIVE

## 2012-11-12 MED ORDER — IRBESARTAN 150 MG PO TABS: 150.0000 mg | ORAL_TABLET | Freq: Every day | ORAL | Status: DC

## 2012-11-12 NOTE — Telephone Encounter (Signed)
Received notice from pharmacy that Diovan is not covered by ins w/out PA, and requesting change to losartan or irbesartan if appropriate. Dr Katrinka Blazing authorized change to irbesartan 150mg  QD. Sending in new Rx.  Notified pt of change. Pt agreed.

## 2012-11-19 ENCOUNTER — Other Ambulatory Visit: Payer: Self-pay | Admitting: Family Medicine

## 2013-01-20 ENCOUNTER — Ambulatory Visit: Payer: 59 | Admitting: Family Medicine

## 2013-02-03 ENCOUNTER — Ambulatory Visit (INDEPENDENT_AMBULATORY_CARE_PROVIDER_SITE_OTHER): Payer: 59 | Admitting: Family Medicine

## 2013-02-03 ENCOUNTER — Encounter: Payer: Self-pay | Admitting: Family Medicine

## 2013-02-03 VITALS — BP 154/102 | HR 63 | Temp 98.6°F | Resp 16 | Ht 73.0 in | Wt 213.8 lb

## 2013-02-03 DIAGNOSIS — I10 Essential (primary) hypertension: Secondary | ICD-10-CM

## 2013-02-03 LAB — BASIC METABOLIC PANEL
BUN: 14 mg/dL (ref 6–23)
CO2: 24 mEq/L (ref 19–32)
Chloride: 105 mEq/L (ref 96–112)
Glucose, Bld: 91 mg/dL (ref 70–99)
Potassium: 4.2 mEq/L (ref 3.5–5.3)
Sodium: 141 mEq/L (ref 135–145)

## 2013-02-03 MED ORDER — IRBESARTAN 300 MG PO TABS: 300.0000 mg | ORAL_TABLET | Freq: Every day | ORAL | Status: DC

## 2013-02-03 NOTE — Progress Notes (Signed)
[address]   [place], [place]  [address]   [phone]  Subjective:    Patient ID: Francisco Gallagher, male    DOB: July 20, 1961, 52 y.o.   MRN: [id number]  HPI This 52 y.o. male presents for evaluation of HTN.  At last visit, decreased Amlodipine to 5mg  daily and started Irbesartan 150mg  one daily.  BP at work running 122/82.  BP running good at work.  BP at work this morning was 120s/80s. Home BP cuff running higher 150s/90s. Not taking Amlodipine any longer; only taking Irbesartan 150mg  daily.  Edema resolved with stopping Amlodipine.  Review of Systems  Constitutional: Negative for fever, chills, diaphoresis and fatigue.  Eyes: Negative for photophobia and visual disturbance.  Respiratory: Negative for shortness of breath, wheezing and stridor.   Cardiovascular: Negative for chest pain, palpitations and leg swelling.  Neurological: Negative for dizziness, tremors, seizures, syncope, facial asymmetry, speech difficulty, weakness, light-headedness, numbness and headaches.   Past Medical History  Diagnosis Date  . Thyroid disease     per pt low thyroid  . Hyperlipidemia   . Hypertension   . Arthritis   . GERD (gastroesophageal reflux disease)   . Erectile dysfunction     Cialis PRN  . Hypogonadism male 08/23/2011    s/p urology consult [facility]; no treatment indicated due to potential of pregnancy.  . Glucose intolerance (impaired glucose tolerance)   . Renal insufficiency   . Multinodular goiter 07/23/2007    thyroid u/s: multinodular goiter; s/p ENT consult/[facility]:  Rx for Synthroid.  Marland Kitchen Unspecified hypothyroidism   . Tobacco use disorder   . Pain in joint, site unspecified   . Unspecified disorder of skin and subcutaneous tissue   . Mild sleep apnea    Past Surgical History  Procedure Laterality Date  . Cholecystectomy  2003  . Sleep study  07/23/2007  . Cardiac catheterization  07/23/1999    negative.  Gillermo Murdoch cyst  1981   Allergies  Allergen Reactions  .  Penicillins Rash   Current Outpatient Prescriptions on File Prior to Visit  Medication Sig Dispense Refill  . fenofibrate 160 MG tablet TAKE 1 TABLET EVERY DAY  30 tablet  0  . levothyroxine (SYNTHROID, LEVOTHROID) 100 MCG tablet TAKE 1 TABLET (100 MCG TOTAL) BY MOUTH DAILY.  90 tablet  1  . meloxicam (MOBIC) 15 MG tablet Take 1 tablet (15 mg total) by mouth daily.  30 tablet  2  . pantoprazole (PROTONIX) 40 MG tablet Take 40 mg by mouth daily.      . tadalafil (CIALIS) 20 MG tablet Take 20 mg by mouth daily as needed.      Marland Kitchen amLODipine (NORVASC) 10 MG tablet Take 0.5 tablets (5 mg total) by mouth daily.  30 tablet  5  . valsartan (DIOVAN) 80 MG tablet Take 1 tablet (80 mg total) by mouth daily.  30 tablet  5   No current facility-administered medications on file prior to visit.       Objective:   Physical Exam  Nursing note and vitals reviewed. Constitutional: He is oriented to person, place, and time. He appears well-developed and well-nourished.  HENT:  Head: Normocephalic and atraumatic.  Eyes: Conjunctivae and EOM are normal. Pupils are equal, round, and reactive to light.  Neck: Normal range of motion. Neck supple. No thyromegaly present.  Cardiovascular: Normal rate, regular rhythm, normal heart sounds and intact distal pulses.  Exam reveals no gallop and no friction rub.   No murmur heard.  No LE edema.  Pulmonary/Chest: Effort normal and breath sounds normal. He has no wheezes. He has no rales.  Lymphadenopathy:    He has no cervical adenopathy.  Neurological: He is alert and oriented to person, place, and time. No cranial nerve deficit. He exhibits normal muscle tone. Coordination normal.  Psychiatric: He has a normal mood and affect. His behavior is normal.       Assessment & Plan:  Essential hypertension, benign - Plan: Basic metabolic panel, irbesartan (AVAPRO) 300 MG tablet  1. HTN: uncontrolled; increase Avapro to 300mg  daily; obtain labs. 2. LE edema: resolved  with stopping Amlodipine.  Meds ordered this encounter  Medications  . irbesartan (AVAPRO) 300 MG tablet    Sig: Take 1 tablet (300 mg total) by mouth daily.    Dispense:  30 tablet    Refill:  5

## 2013-04-14 ENCOUNTER — Ambulatory Visit (INDEPENDENT_AMBULATORY_CARE_PROVIDER_SITE_OTHER): Payer: 59 | Admitting: Family Medicine

## 2013-04-14 ENCOUNTER — Encounter: Payer: Self-pay | Admitting: Family Medicine

## 2013-04-14 VITALS — BP 172/100 | HR 59 | Temp 98.4°F | Resp 16 | Ht 72.5 in | Wt 217.8 lb

## 2013-04-14 DIAGNOSIS — R7309 Other abnormal glucose: Secondary | ICD-10-CM

## 2013-04-14 DIAGNOSIS — E782 Mixed hyperlipidemia: Secondary | ICD-10-CM

## 2013-04-14 DIAGNOSIS — I10 Essential (primary) hypertension: Secondary | ICD-10-CM

## 2013-04-14 LAB — LIPID PANEL
LDL Cholesterol: 103 mg/dL — ABNORMAL HIGH (ref 0–99)
Triglycerides: 203 mg/dL — ABNORMAL HIGH (ref <150)
VLDL: 41 mg/dL — ABNORMAL HIGH (ref 0–40)

## 2013-04-14 LAB — CBC WITH DIFFERENTIAL/PLATELET
Basophils Absolute: 0 10*3/uL (ref 0.0–0.1)
Basophils Relative: 1 % (ref 0–1)
Eosinophils Absolute: 0.4 10*3/uL (ref 0.0–0.7)
Eosinophils Relative: 5 % (ref 0–5)
HCT: 42 % (ref 39.0–52.0)
Lymphocytes Relative: 23 % (ref 12–46)
MCH: 31.1 pg (ref 26.0–34.0)
MCHC: 35.2 g/dL (ref 30.0–36.0)
MCV: 88.2 fL (ref 78.0–100.0)
Monocytes Absolute: 0.5 10*3/uL (ref 0.1–1.0)
RDW: 13.1 % (ref 11.5–15.5)

## 2013-04-14 LAB — COMPREHENSIVE METABOLIC PANEL
ALT: 25 U/L (ref 0–53)
AST: 20 U/L (ref 0–37)
Albumin: 4.9 g/dL (ref 3.5–5.2)
CO2: 27 mEq/L (ref 19–32)
Calcium: 9.9 mg/dL (ref 8.4–10.5)
Chloride: 104 mEq/L (ref 96–112)
Creat: 0.98 mg/dL (ref 0.50–1.35)
Potassium: 4.2 mEq/L (ref 3.5–5.3)
Sodium: 138 mEq/L (ref 135–145)
Total Protein: 7.4 g/dL (ref 6.0–8.3)

## 2013-04-14 LAB — HEMOGLOBIN A1C: Hgb A1c MFr Bld: 6.1 % — ABNORMAL HIGH (ref <5.7)

## 2013-04-14 MED ORDER — IRBESARTAN 300 MG PO TABS: 300.0000 mg | ORAL_TABLET | Freq: Every day | ORAL | Status: DC

## 2013-04-14 MED ORDER — HYDROCHLOROTHIAZIDE 12.5 MG PO TABS: 12.5000 mg | ORAL_TABLET | Freq: Every day | ORAL | Status: DC

## 2013-04-14 MED ORDER — MELOXICAM 15 MG PO TABS: 15.0000 mg | ORAL_TABLET | Freq: Every day | ORAL | Status: DC

## 2013-04-14 MED ORDER — LEVOTHYROXINE SODIUM 100 MCG PO TABS: 100.0000 ug | ORAL_TABLET | Freq: Every day | ORAL | Status: DC

## 2013-04-14 MED ORDER — PANTOPRAZOLE SODIUM 40 MG PO TBEC: 40.0000 mg | DELAYED_RELEASE_TABLET | Freq: Every day | ORAL | Status: DC

## 2013-04-14 MED ORDER — FENOFIBRATE 160 MG PO TABS: 160.0000 mg | ORAL_TABLET | Freq: Every day | ORAL | Status: DC

## 2013-04-14 NOTE — Progress Notes (Signed)
[address]   [place], [place]  [address]   [phone]  Subjective:    Patient ID: Francisco Gallagher, male    DOB: 07/23/1960, 52 y.o.   MRN: [id number]  HPI This 52 y.o. male presents for evaluation of HTN.  Home BP running 135-140/78-80.  Management at last viist included increasing AVapro to 300mg  daily.  Reports good compliance with medication; good tolerance to medication; good symptom control.  2.  Glucose intolerance:  Stable; continues to eat sweets regularly.  Due for repeat labs.  3.  Hyperlipidemia: six month follow-up; no changes to management made at last visit; reports good compliance with medication; good tolerance to medication; good symptom control.  Review of Systems  Constitutional: Negative for fever, chills, diaphoresis and fatigue.  Eyes: Negative for visual disturbance.  Respiratory: Negative for cough and shortness of breath.   Cardiovascular: Negative for chest pain, palpitations and leg swelling.  Endocrine: Negative for cold intolerance, heat intolerance, polydipsia, polyphagia and polyuria.  Neurological: Negative for dizziness, tremors, seizures, syncope, weakness, light-headedness, numbness and headaches.   Past Medical History  Diagnosis Date  . Thyroid disease     per pt low thyroid  . Hyperlipidemia   . Hypertension   . Arthritis   . GERD (gastroesophageal reflux disease)   . Erectile dysfunction     Cialis PRN  . Hypogonadism male 08/23/2011    s/p urology consult [facility]; no treatment indicated due to potential of pregnancy.  . Glucose intolerance (impaired glucose tolerance)   . Renal insufficiency   . Multinodular goiter 07/23/2007    thyroid u/s: multinodular goiter; s/p ENT consult/[facility]:  Rx for Synthroid.  Marland Kitchen Unspecified hypothyroidism   . Tobacco use disorder   . Pain in joint, site unspecified   . Unspecified disorder of skin and subcutaneous tissue   . Mild sleep apnea    Past Surgical History  Procedure Laterality Date  .  Cholecystectomy  2003  . Sleep study  07/23/2007  . Cardiac catheterization  07/23/1999    negative.  Gillermo Murdoch cyst  1981   Allergies  Allergen Reactions  . Penicillins Rash   Current Outpatient Prescriptions on File Prior to Visit  Medication Sig Dispense Refill  . tadalafil (CIALIS) 20 MG tablet Take 20 mg by mouth daily as needed.       No current facility-administered medications on file prior to visit.   History   Social History  . Marital Status: Single    Spouse Name: N/A    Number of Children: 0  . Years of Education: N/A   Occupational History  . 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 Smoking status: Never Smoker   . Smokeless tobacco: Current User     Comment: PATIENT CHEWS TOBACCO  20 years  . 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 Drug Use: No  . Sexual Activity: Yes   Other Topics Concern  . Not on file   Social History Narrative   Marital status: single; dating same male x 5 years; girlfriend diagnosed with Breast Cancer 2013.      Children: none      Lives: with parents      Employment:  Works at [employer] in [employer] x 25 years; happy      Tobacco:  Chews tobacco x 23 years  Alcohol:  5 times per year at rock concerts      Drugs:  None      Exercise: sporadic      Seatbelt:  50% of time      Guns: loaded secured guns in home.       Sexual activity: sexually active; no STDs; total sexual partners < 10.   Smoke alarm and carbon monoxide detector in the home.   Caffeine use: carbonated beverages, moderate amount.   Family History  Problem Relation Age of Onset  . Arthritis Mother   . Hypertension Mother   . COPD Mother   . Depression Mother   . Fibromyalgia Mother   . Gout Mother   . Kidney disease Mother   . Cancer Mother     lung  . Heart disease Mother   . Hypertension Father   . Benign prostatic hyperplasia Father   . Rosacea Father   . Arthritis Brother    . Heart disease Maternal Grandfather   . Hypertension Paternal Grandmother   . Stroke Paternal Grandfather   . Diabetes         Objective:   Physical Exam  Nursing note and vitals reviewed. Constitutional: He is oriented to person, place, and time. He appears well-developed and well-nourished. No distress.  HENT:  Head: Normocephalic and atraumatic.  Mouth/Throat: Oropharynx is clear and moist.  Eyes: Conjunctivae and EOM are normal. Pupils are equal, round, and reactive to light.  Neck: Normal range of motion. Neck supple. No JVD present. No thyromegaly present.  Cardiovascular: Normal rate, regular rhythm and normal heart sounds.  Exam reveals no gallop.   No murmur heard. Pulmonary/Chest: Effort normal and breath sounds normal. He has no wheezes. He has no rales.  Lymphadenopathy:    He has no cervical adenopathy.  Neurological: He is alert and oriented to person, place, and time. No cranial nerve deficit. He exhibits normal muscle tone. Coordination normal.  Skin: Skin is warm and dry. No rash noted. He is not diaphoretic.  Psychiatric: He has a normal mood and affect. His behavior is normal.       Assessment & Plan:  Essential hypertension, benign - Plan: Comprehensive metabolic panel, irbesartan (AVAPRO) 300 MG tablet  Other abnormal glucose - Plan: Hemoglobin A1c  Mixed hyperlipidemia - Plan: CBC with Differential, CK, Comprehensive metabolic panel, Lipid panel  1. HTN: moderately controlled; add HCTZ 12.5mg  daily to Avapro 300mg  daily; obtain labs. 2.  Glucose intolerance: controlled with dietary modifications; obtain labs. 3.  Hyperlipidemia: controlled moderately; obtain labs; continue current medications.  Meds ordered this encounter  Medications  . hydrochlorothiazide (HYDRODIURIL) 12.5 MG tablet    Sig: Take 1 tablet (12.5 mg total) by mouth daily.    Dispense:  90 tablet    Refill:  1  . fenofibrate 160 MG tablet    Sig: Take 1 tablet (160 mg total) by  mouth daily.    Dispense:  90 tablet    Refill:  3  . irbesartan (AVAPRO) 300 MG tablet    Sig: Take 1 tablet (300 mg total) by mouth daily.    Dispense:  90 tablet    Refill:  3  . levothyroxine (SYNTHROID, LEVOTHROID) 100 MCG tablet    Sig: Take 1 tablet (100 mcg total) by mouth daily before breakfast.    Dispense:  90 tablet    Refill:  3  . meloxicam (MOBIC) 15 MG tablet    Sig: Take 1 tablet (15 mg total) by  mouth daily.    Dispense:  90 tablet    Refill:  1  . pantoprazole (PROTONIX) 40 MG tablet    Sig: Take 1 tablet (40 mg total) by mouth daily.    Dispense:  90 tablet    Refill:  3   Nilda Simmer, M.D.  [facility] [address] [place], [place]  [address] [phone] phone [phone] fax

## 2013-04-14 NOTE — Patient Instructions (Addendum)
Raynaud's syndrome

## 2013-05-17 ENCOUNTER — Other Ambulatory Visit: Payer: Self-pay | Admitting: Family Medicine

## 2013-08-16 ENCOUNTER — Ambulatory Visit (INDEPENDENT_AMBULATORY_CARE_PROVIDER_SITE_OTHER): Payer: 59 | Admitting: Family Medicine

## 2013-08-16 ENCOUNTER — Encounter: Payer: Self-pay | Admitting: Family Medicine

## 2013-08-16 VITALS — BP 150/100 | HR 50 | Temp 98.3°F | Resp 16 | Ht 73.0 in | Wt 219.0 lb

## 2013-08-16 DIAGNOSIS — E291 Testicular hypofunction: Secondary | ICD-10-CM

## 2013-08-16 DIAGNOSIS — Z0000 Encounter for general adult medical examination without abnormal findings: Secondary | ICD-10-CM

## 2013-08-16 DIAGNOSIS — Z7251 High risk heterosexual behavior: Secondary | ICD-10-CM | POA: Insufficient documentation

## 2013-08-16 DIAGNOSIS — I10 Essential (primary) hypertension: Secondary | ICD-10-CM

## 2013-08-16 DIAGNOSIS — Z1211 Encounter for screening for malignant neoplasm of colon: Secondary | ICD-10-CM

## 2013-08-16 DIAGNOSIS — F430 Acute stress reaction: Secondary | ICD-10-CM

## 2013-08-16 DIAGNOSIS — E785 Hyperlipidemia, unspecified: Secondary | ICD-10-CM

## 2013-08-16 DIAGNOSIS — N529 Male erectile dysfunction, unspecified: Secondary | ICD-10-CM

## 2013-08-16 DIAGNOSIS — E039 Hypothyroidism, unspecified: Secondary | ICD-10-CM

## 2013-08-16 DIAGNOSIS — Z125 Encounter for screening for malignant neoplasm of prostate: Secondary | ICD-10-CM | POA: Insufficient documentation

## 2013-08-16 DIAGNOSIS — K219 Gastro-esophageal reflux disease without esophagitis: Secondary | ICD-10-CM

## 2013-08-16 LAB — COMPLETE METABOLIC PANEL WITH GFR
ALT: 27 U/L (ref 0–53)
AST: 21 U/L (ref 0–37)
Albumin: 4.9 g/dL (ref 3.5–5.2)
Alkaline Phosphatase: 33 U/L — ABNORMAL LOW (ref 39–117)
BILIRUBIN TOTAL: 0.8 mg/dL (ref 0.3–1.2)
BUN: 20 mg/dL (ref 6–23)
CO2: 24 mEq/L (ref 19–32)
CREATININE: 1.36 mg/dL — AB (ref 0.50–1.35)
Calcium: 9.5 mg/dL (ref 8.4–10.5)
Chloride: 105 mEq/L (ref 96–112)
GFR, EST NON AFRICAN AMERICAN: 60 mL/min
GFR, Est African American: 69 mL/min
Glucose, Bld: 106 mg/dL — ABNORMAL HIGH (ref 70–99)
Potassium: 4.5 mEq/L (ref 3.5–5.3)
Sodium: 137 mEq/L (ref 135–145)
Total Protein: 7.1 g/dL (ref 6.0–8.3)

## 2013-08-16 LAB — LIPID PANEL
CHOLESTEROL: 146 mg/dL (ref 0–200)
HDL: 29 mg/dL — ABNORMAL LOW (ref >39)
LDL Cholesterol: 94 mg/dL (ref 0–99)
Total CHOL/HDL Ratio: 5 Ratio
Triglycerides: 114 mg/dL (ref <150)
VLDL: 23 mg/dL (ref 0–40)

## 2013-08-16 LAB — CBC WITH DIFFERENTIAL/PLATELET
Basophils Absolute: 0.1 10*3/uL (ref 0.0–0.1)
Basophils Relative: 1 % (ref 0–1)
EOS ABS: 0.5 10*3/uL (ref 0.0–0.7)
EOS PCT: 8 % — AB (ref 0–5)
HEMATOCRIT: 41.7 % (ref 39.0–52.0)
Hemoglobin: 14.5 g/dL (ref 13.0–17.0)
Lymphocytes Relative: 20 % (ref 12–46)
Lymphs Abs: 1.1 10*3/uL (ref 0.7–4.0)
MCH: 31.7 pg (ref 26.0–34.0)
MCHC: 34.8 g/dL (ref 30.0–36.0)
MCV: 91 fL (ref 78.0–100.0)
MONO ABS: 0.5 10*3/uL (ref 0.1–1.0)
Monocytes Relative: 8 % (ref 3–12)
Neutro Abs: 3.5 10*3/uL (ref 1.7–7.7)
Neutrophils Relative %: 63 % (ref 43–77)
PLATELETS: 260 10*3/uL (ref 150–400)
RBC: 4.58 MIL/uL (ref 4.22–5.81)
RDW: 13 % (ref 11.5–15.5)
WBC: 5.6 10*3/uL (ref 4.0–10.5)

## 2013-08-16 LAB — IFOBT (OCCULT BLOOD): IFOBT: NEGATIVE

## 2013-08-16 LAB — POCT URINALYSIS DIPSTICK
BILIRUBIN UA: NEGATIVE
Glucose, UA: NEGATIVE
Ketones, UA: NEGATIVE
LEUKOCYTES UA: NEGATIVE
NITRITE UA: NEGATIVE
RBC UA: NEGATIVE
Spec Grav, UA: 1.02
Urobilinogen, UA: 1
pH, UA: 6

## 2013-08-16 LAB — PSA: PSA: 2.59 ng/mL (ref <=4.00)

## 2013-08-16 LAB — T4, FREE: Free T4: 1.17 ng/dL (ref 0.80–1.80)

## 2013-08-16 LAB — TSH: TSH: 2.206 u[IU]/mL (ref 0.350–4.500)

## 2013-08-16 LAB — RPR

## 2013-08-16 LAB — HEMOGLOBIN A1C
Hgb A1c MFr Bld: 6 % — ABNORMAL HIGH (ref <5.7)
MEAN PLASMA GLUCOSE: 126 mg/dL — AB (ref <117)

## 2013-08-16 LAB — HIV ANTIBODY (ROUTINE TESTING W REFLEX): HIV: NONREACTIVE

## 2013-08-16 MED ORDER — IRBESARTAN 300 MG PO TABS: 300.0000 mg | ORAL_TABLET | Freq: Every day | ORAL | Status: DC

## 2013-08-16 MED ORDER — LEVOTHYROXINE SODIUM 100 MCG PO TABS: 100.0000 ug | ORAL_TABLET | Freq: Every day | ORAL | Status: DC

## 2013-08-16 MED ORDER — PANTOPRAZOLE SODIUM 40 MG PO TBEC: 40.0000 mg | DELAYED_RELEASE_TABLET | Freq: Every day | ORAL | Status: DC

## 2013-08-16 MED ORDER — FENOFIBRATE 160 MG PO TABS: 160.0000 mg | ORAL_TABLET | Freq: Every day | ORAL | Status: DC

## 2013-08-16 MED ORDER — MELOXICAM 15 MG PO TABS: 15.0000 mg | ORAL_TABLET | Freq: Every day | ORAL | Status: DC

## 2013-08-16 MED ORDER — TADALAFIL 20 MG PO TABS: 20.0000 mg | ORAL_TABLET | Freq: Every day | ORAL | Status: DC | PRN

## 2013-08-16 NOTE — Assessment & Plan Note (Signed)
Anticipatory guidance; obtain RPR, HIV; obtain uriprobe next visit.

## 2013-08-16 NOTE — Assessment & Plan Note (Signed)
Refused colonoscopy due to high deductible; hemosure obtained; asymptomatic.

## 2013-08-16 NOTE — Assessment & Plan Note (Signed)
Completed; s/p DRE; obtain PSA.

## 2013-08-16 NOTE — Assessment & Plan Note (Signed)
Stable; non-compliant with HCTZ due to increased urination; stable home readings.

## 2013-08-16 NOTE — Assessment & Plan Note (Signed)
Stable; coping well with death of girlfriend.

## 2013-08-16 NOTE — Progress Notes (Signed)
Subjective:    Patient ID: Francisco Gallagher, male    DOB: 12-16-1960, 53 y.o.   MRN: [id number]  HPI This 53 y.o. male presents for Complete Physical Examination.  Last physical 08/03/2012. TDAP 01/18/2008. Influenza never; refuses. Colonoscopy never; hemosure negative in 07/2012.  Has high deductible.   Eye exam 05/2013; +glasses; [place]. Dental exam [facility] 2011.   HTN: added HCTZ 12.5mg  one tablet daily at visit 04/14/13.  Does not take every morning.  Increased urination.  Home BP 130/75.    Chest cold: onset 07/21/13.  Improving.  Took OTC Robitussin DM.  Still with sinus congestion but getting better.  Review of Systems  Constitutional: Negative.   HENT: Positive for congestion, sinus pressure and tinnitus. Negative for ear pain, postnasal drip, rhinorrhea, sore throat, trouble swallowing and voice change.   Eyes: Negative.   Respiratory: Positive for cough. Negative for shortness of breath, wheezing and stridor.   Cardiovascular: Negative for chest pain, palpitations and leg swelling.  Gastrointestinal: Negative.   Endocrine: Negative.   Genitourinary: Negative for dysuria, urgency, frequency, hematuria, flank pain, discharge, penile swelling, scrotal swelling, enuresis, genital sores, penile pain and testicular pain.  Musculoskeletal: Positive for arthralgias and myalgias. Negative for back pain, gait problem, joint swelling, neck pain and neck stiffness.  Skin: Negative.   Neurological: Negative.   Hematological: Negative.   Psychiatric/Behavioral: Negative.    Past Medical History  Diagnosis Date  . Thyroid disease     per pt low thyroid  . Hyperlipidemia   . Hypertension   . Arthritis   . GERD (gastroesophageal reflux disease)   . Erectile dysfunction     Cialis PRN  . Hypogonadism male 08/23/2011    s/p urology consult [facility]; no treatment indicated due to potential of pregnancy.  . Glucose intolerance (impaired glucose  tolerance)   . Renal insufficiency   . Multinodular goiter 07/23/2007    thyroid u/s: multinodular goiter; s/p ENT consult/Bennett:  Rx for Synthroid.  Marland Kitchen Unspecified hypothyroidism   . Tobacco use disorder   . Pain in joint, site unspecified   . Unspecified disorder of skin and subcutaneous tissue   . Mild sleep apnea    Past Surgical History  Procedure Laterality Date  . Cholecystectomy  2003  . Sleep study  07/23/2007  . Cardiac catheterization  07/23/1999    negative.  [place] Hals cyst  1981   Allergies  Allergen Reactions  . Penicillins Rash   Current Outpatient Prescriptions on File Prior to Visit  Medication Sig Dispense Refill  . hydrochlorothiazide (HYDRODIURIL) 12.5 MG tablet Take 1 tablet (12.5 mg total) by mouth daily.  90 tablet  1   No current facility-administered medications on file prior to visit.   History   Social History  . Marital Status: Single    Spouse Name: N/A    Number of Children: 0  . Years of Education: N/A   Occupational History  . 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 Smoking status: Never Smoker   . Smokeless tobacco: Current User     Comment: PATIENT CHEWS TOBACCO  20 years  . 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 Drug Use: No  . Sexual Activity: Yes   Other Topics Concern  . Not on file   Social History Narrative   Marital status:  single; girlfriend passed in 02/2013 of breast cancer age 54.      Children: none      Lives: with parents       Employment:  Works at [employer] in [place] x 26 years; happy      Tobacco:  Chews tobacco x 24 years      Alcohol:  5 times per year at [employer]      Drugs:  None      Exercise: sporadic      Seatbelt:  50% of time      Guns: loaded secured guns in home.       Sexual activity: sexually active; no STDs; total sexual partners < 10.        Smoke alarm and carbon monoxide detector in the home.      Caffeine use:  carbonated beverages, moderate amount.   Family History  Problem Relation Age of Onset  . Arthritis Mother   . Hypertension Mother   . COPD Mother   . Depression Mother   . Fibromyalgia Mother   . Gout Mother   . Kidney disease Mother   . Cancer Mother     lung  . Heart disease Mother   . Hypertension Father   . Benign prostatic hyperplasia Father   . Rosacea Father   . Arthritis Brother   . Heart disease Maternal Grandfather   . Hypertension Paternal Grandmother   . Stroke Paternal Grandfather   . Diabetes         Objective:   Physical Exam  Nursing note and vitals reviewed. Constitutional: He is oriented to person, place, and time. He appears well-developed and well-nourished. No distress.  HENT:  Head: Normocephalic and atraumatic.  Right Ear: External ear normal.  Left Ear: External ear normal.  Nose: Nose normal.  Mouth/Throat: Oropharynx is clear and moist.  Eyes: Conjunctivae and EOM are normal. Pupils are equal, round, and reactive to light.  Neck: Normal range of motion. Neck supple. Carotid bruit is not present. No thyromegaly present.  Cardiovascular: Normal rate, regular rhythm, normal heart sounds and intact distal pulses.  Exam reveals no gallop and no friction rub.   No murmur heard. Pulmonary/Chest: Effort normal and breath sounds normal. He has no wheezes. He has no rales.  Abdominal: Soft. Bowel sounds are normal. He exhibits no distension and no mass. There is no tenderness. There is no rebound and no guarding. Hernia confirmed negative in the right inguinal area and confirmed negative in the left inguinal area.  Genitourinary: Rectum normal, testes normal and penis normal. Prostate is enlarged. Prostate is not tender. Right testis shows no mass, no swelling and no tenderness. Left testis shows no mass, no swelling and no tenderness. Circumcised.  Musculoskeletal:       Right shoulder: Normal.       Left shoulder: Normal.       Cervical back: Normal.    Lymphadenopathy:    He has no cervical adenopathy.       Right: No inguinal adenopathy present.       Left: No inguinal adenopathy present.  Neurological: He is alert and oriented to person, place, and time. He has normal reflexes. No cranial nerve deficit. He exhibits normal muscle tone. Coordination normal.  Skin: Skin is warm and dry. No rash noted. He is not diaphoretic.  Psychiatric: He has a normal mood and affect. His behavior is normal. Judgment and thought content normal.   EKG: sinus bradycardia at 47;  no ST changes; normal intervals.    Assessment & Plan:  Annual physical exam - Plan: CBC with Differential, COMPLETE METABOLIC PANEL WITH GFR, Lipid panel, Hemoglobin A1c, TSH, T4, free, PSA, Vit D  25 hydroxy (rtn osteoporosis monitoring), POCT urinalysis dipstick, EKG 12-Lead, IFOBT POC (occult bld, rslt in office)  Essential hypertension, benign - Plan: POCT urinalysis dipstick, EKG 12-Lead, irbesartan (AVAPRO) 300 MG tablet  Problems related to high-risk sexual behavior - Plan: HIV antibody, RPR  Colon cancer screening - Plan: IFOBT POC (occult bld, rslt in office)  Prostate cancer screening - Plan: PSA  Routine general medical examination at a health care facility  Erectile dysfunction  GERD (gastroesophageal reflux disease)  Hyperlipidemia  Hypogonadism male  Hypothyroidism  Stress reaction  Meds ordered this encounter  Medications  . fenofibrate 160 MG tablet    Sig: Take 1 tablet (160 mg total) by mouth daily.    Dispense:  90 tablet    Refill:  3  . irbesartan (AVAPRO) 300 MG tablet    Sig: Take 1 tablet (300 mg total) by mouth daily.    Dispense:  90 tablet    Refill:  3  . levothyroxine (SYNTHROID, LEVOTHROID) 100 MCG tablet    Sig: Take 1 tablet (100 mcg total) by mouth daily before breakfast.    Dispense:  90 tablet    Refill:  3  . meloxicam (MOBIC) 15 MG tablet    Sig: Take 1 tablet (15 mg total) by mouth daily.    Dispense:  90 tablet     Refill:  3  . pantoprazole (PROTONIX) 40 MG tablet    Sig: Take 1 tablet (40 mg total) by mouth daily.    Dispense:  90 tablet    Refill:  3  . tadalafil (CIALIS) 20 MG tablet    Sig: Take 1 tablet (20 mg total) by mouth daily as needed.    Dispense:  8 tablet    Refill:  11   Reginia Forts, M.D.  [facility] [address] [place], [place]  [address] [phone] phone [phone] fax

## 2013-08-16 NOTE — Assessment & Plan Note (Signed)
Anticipatory guidance provided.  Declined colonoscopy; hemosure obtained.  Immunizations reviewed; pt declined flu vaccine.  Obtain labs.  Encourage tobacco cessation.

## 2013-08-17 LAB — VITAMIN D 25 HYDROXY (VIT D DEFICIENCY, FRACTURES): VIT D 25 HYDROXY: 37 ng/mL (ref 30–89)

## 2013-08-19 ENCOUNTER — Telehealth: Payer: Self-pay

## 2013-08-19 NOTE — Telephone Encounter (Signed)
Pt aware of lab results 

## 2013-08-19 NOTE — Telephone Encounter (Signed)
PATIENT STATES HE SAW DR. Tamala Julian ON Monday FOR AN ANNUAL PHYSICAL. SOMEONE TRIED TO CALL HIM (HE THINKS) WITH HIS LAB RESULTS. BEST PHONE [phone]   PHARMACY CHOICE IS [facility] IN [place], [address]  [place]

## 2013-08-22 ENCOUNTER — Ambulatory Visit (INDEPENDENT_AMBULATORY_CARE_PROVIDER_SITE_OTHER): Payer: 59 | Admitting: Family Medicine

## 2013-08-22 ENCOUNTER — Encounter: Payer: Self-pay | Admitting: Family Medicine

## 2013-08-22 VITALS — BP 126/76 | HR 82 | Temp 98.9°F | Resp 16 | Ht 73.0 in | Wt 216.4 lb

## 2013-08-22 DIAGNOSIS — R0981 Nasal congestion: Secondary | ICD-10-CM

## 2013-08-22 DIAGNOSIS — J029 Acute pharyngitis, unspecified: Secondary | ICD-10-CM

## 2013-08-22 DIAGNOSIS — R059 Cough, unspecified: Secondary | ICD-10-CM

## 2013-08-22 DIAGNOSIS — J111 Influenza due to unidentified influenza virus with other respiratory manifestations: Secondary | ICD-10-CM

## 2013-08-22 DIAGNOSIS — R05 Cough: Secondary | ICD-10-CM

## 2013-08-22 LAB — POCT CBC
Granulocyte percent: 83.6 %G — AB (ref 37–80)
HCT, POC: 43.7 % (ref 43.5–53.7)
Hemoglobin: 13.8 g/dL — AB (ref 14.1–18.1)
LYMPH, POC: 0.5 — AB (ref 0.6–3.4)
MCH: 31.2 pg (ref 27–31.2)
MCHC: 31.6 g/dL — AB (ref 31.8–35.4)
MCV: 98.6 fL — AB (ref 80–97)
MID (CBC): 0.5 (ref 0–0.9)
MPV: 8 fL (ref 0–99.8)
PLATELET COUNT, POC: 230 10*3/uL (ref 142–424)
POC Granulocyte: 4.8 (ref 2–6.9)
POC LYMPH PERCENT: 7.9 %L — AB (ref 10–50)
POC MID %: 8.5 % (ref 0–12)
RBC: 4.43 M/uL — AB (ref 4.69–6.13)
RDW, POC: 13 %
WBC: 5.7 10*3/uL (ref 4.6–10.2)

## 2013-08-22 LAB — POCT RAPID STREP A (OFFICE): Rapid Strep A Screen: NEGATIVE

## 2013-08-22 LAB — POCT INFLUENZA A/B
Influenza A, POC: NEGATIVE
Influenza B, POC: NEGATIVE

## 2013-08-22 MED ORDER — OSELTAMIVIR PHOSPHATE 75 MG PO CAPS: 75.0000 mg | ORAL_CAPSULE | Freq: Two times a day (BID) | ORAL | Status: DC

## 2013-08-22 NOTE — Progress Notes (Signed)
Subjective:    Patient ID: Francisco Gallagher, male    DOB: 04/29/1961, 53 y.o.   MRN: [id number]  Sinusitis Associated symptoms include congestion, coughing, ear pain, headaches and a sore throat. Pertinent negatives include no chills, diaphoresis or shortness of breath.   This 53 y.o. male presents for evaluation of flu like symptoms.  Onset last night.  +feverish.  No chills/sweats.  +body aches bad.  Mild headache; +pressure.  +ST severe.  No ear pain.  +rhinorrhea; +PND.  +nasal congestion.  +coughing; +sputum yellow.  No SOB.  No n/v/d.  No rash. Father is sick.  Robitussin and BP Cortisedan.  No flu vaccine this year.   Review of Systems  Constitutional: Positive for fatigue. Negative for fever, chills and diaphoresis.  HENT: Positive for congestion, ear pain, postnasal drip, rhinorrhea, sore throat, trouble swallowing and voice change.   Respiratory: Positive for cough. Negative for shortness of breath, wheezing and stridor.   Gastrointestinal: Negative for nausea, vomiting and diarrhea.  Skin: Negative for rash.  Neurological: Positive for headaches.   Past Medical History  Diagnosis Date  . Thyroid disease     per pt low thyroid  . Hyperlipidemia   . Hypertension   . Arthritis   . GERD (gastroesophageal reflux disease)   . Erectile dysfunction     Cialis PRN  . Hypogonadism male 08/23/2011    s/p urology consult [facility]; no treatment indicated due to potential of pregnancy.  . Glucose intolerance (impaired glucose tolerance)   . Renal insufficiency   . Multinodular goiter 07/23/2007    thyroid u/s: multinodular goiter; s/p ENT consult/[facility]:  Rx for Synthroid.  Marland Kitchen Unspecified hypothyroidism   . Tobacco use disorder   . Pain in joint, site unspecified   . Unspecified disorder of skin and subcutaneous tissue   . Mild sleep apnea    Allergies  Allergen Reactions  . Penicillins Rash   Current Outpatient Prescriptions on File Prior to Visit  Medication Sig Dispense  Refill  . fenofibrate 160 MG tablet Take 1 tablet (160 mg total) by mouth daily.  90 tablet  3  . hydrochlorothiazide (HYDRODIURIL) 12.5 MG tablet Take 1 tablet (12.5 mg total) by mouth daily.  90 tablet  1  . irbesartan (AVAPRO) 300 MG tablet Take 1 tablet (300 mg total) by mouth daily.  90 tablet  3  . levothyroxine (SYNTHROID, LEVOTHROID) 100 MCG tablet Take 1 tablet (100 mcg total) by mouth daily before breakfast.  90 tablet  3  . meloxicam (MOBIC) 15 MG tablet Take 1 tablet (15 mg total) by mouth daily.  90 tablet  3  . pantoprazole (PROTONIX) 40 MG tablet Take 1 tablet (40 mg total) by mouth daily.  90 tablet  3  . tadalafil (CIALIS) 20 MG tablet Take 1 tablet (20 mg total) by mouth daily as needed.  8 tablet  11   No current facility-administered medications on file prior to visit.        Objective:   Physical Exam  Nursing note and vitals reviewed. Constitutional: He is oriented to person, place, and time. He appears well-developed and well-nourished. No distress.  HENT:  Head: Normocephalic and atraumatic.  Right Ear: External ear normal.  Left Ear: External ear normal.  Mouth/Throat: Mucous membranes are normal. Posterior oropharyngeal erythema present. No oropharyngeal exudate, posterior oropharyngeal edema or tonsillar abscesses.  Eyes: Conjunctivae and EOM are normal. Pupils are equal, round, and reactive to light.  Neck: Normal range of motion. Neck  supple.  Cardiovascular: Normal rate and regular rhythm.   No murmur heard. Pulmonary/Chest: Effort normal and breath sounds normal. He has no wheezes. He has no rales.  Lymphadenopathy:    He has cervical adenopathy.  Neurological: He is alert and oriented to person, place, and time.  Skin: Skin is warm and dry. No rash noted. He is not diaphoretic.  Psychiatric: He has a normal mood and affect. His behavior is normal.   Results for orders placed in visit on 08/22/13  POCT INFLUENZA A/B      Result Value Range    Influenza A, POC Negative     Influenza B, POC Negative    POCT CBC      Result Value Range   WBC 5.7  4.6 - 10.2 K/uL   Lymph, poc 0.5 (*) 0.6 - 3.4   POC LYMPH PERCENT 7.9 (*) 10 - 50 %L   MID (cbc) 0.5  0 - 0.9   POC MID % 8.5  0 - 12 %M   POC Granulocyte 4.8  2 - 6.9   Granulocyte percent 83.6 (*) 37 - 80 %G   RBC 4.43 (*) 4.69 - 6.13 M/uL   Hemoglobin 13.8 (*) 14.1 - 18.1 g/dL   HCT, POC 43.7  43.5 - 53.7 %   MCV 98.6 (*) 80 - 97 fL   MCH, POC 31.2  27 - 31.2 pg   MCHC 31.6 (*) 31.8 - 35.4 g/dL   RDW, POC 13.0     Platelet Count, POC 230  142 - 424 K/uL   MPV 8.0  0 - 99.8 fL  POCT RAPID STREP A (OFFICE)      Result Value Range   Rapid Strep A Screen Negative  Negative       Assessment & Plan:  Nasal congestion - Plan: POCT Influenza A/B  Acute pharyngitis - Plan: POCT CBC, POCT rapid strep A, Culture, Group A Strep  Cough - Plan: POCT CBC, POCT rapid strep A  Influenza  1.  Influenza: New . Send throat culture. Due to mother with COPD, will treat with Tamiflu.  Also continue Cortisedin and Robitussin.  RTC for acute worsening. OOW note for 2/2, 2/3.    Meds ordered this encounter  Medications  . oseltamivir (TAMIFLU) 75 MG capsule    Sig: Take 1 capsule (75 mg total) by mouth 2 (two) times daily.    Dispense:  10 capsule    Refill:  0   Reginia Forts, M.D.  [facility] [address] [place], [place]  [address] [phone] phone [phone] fax

## 2013-08-22 NOTE — Patient Instructions (Signed)

## 2013-08-24 LAB — CULTURE, GROUP A STREP: Organism ID, Bacteria: NORMAL

## 2013-08-25 NOTE — Progress Notes (Signed)
PA approved for Cialis through 08/25/14. Notified pharm.

## 2013-10-18 ENCOUNTER — Other Ambulatory Visit: Payer: Self-pay | Admitting: Family Medicine

## 2014-02-16 ENCOUNTER — Encounter: Payer: Self-pay | Admitting: Family Medicine

## 2014-02-16 ENCOUNTER — Ambulatory Visit (INDEPENDENT_AMBULATORY_CARE_PROVIDER_SITE_OTHER): Payer: 59 | Admitting: Family Medicine

## 2014-02-16 VITALS — BP 134/94 | HR 62 | Temp 98.0°F | Resp 16 | Ht 72.0 in | Wt 218.0 lb

## 2014-02-16 DIAGNOSIS — R7309 Other abnormal glucose: Secondary | ICD-10-CM

## 2014-02-16 DIAGNOSIS — I10 Essential (primary) hypertension: Secondary | ICD-10-CM

## 2014-02-16 DIAGNOSIS — E039 Hypothyroidism, unspecified: Secondary | ICD-10-CM

## 2014-02-16 DIAGNOSIS — F430 Acute stress reaction: Secondary | ICD-10-CM

## 2014-02-16 DIAGNOSIS — E785 Hyperlipidemia, unspecified: Secondary | ICD-10-CM

## 2014-02-16 DIAGNOSIS — S6010XA Contusion of unspecified finger with damage to nail, initial encounter: Secondary | ICD-10-CM

## 2014-02-16 DIAGNOSIS — R7302 Impaired glucose tolerance (oral): Secondary | ICD-10-CM

## 2014-02-16 DIAGNOSIS — K219 Gastro-esophageal reflux disease without esophagitis: Secondary | ICD-10-CM

## 2014-02-16 DIAGNOSIS — S6000XA Contusion of unspecified finger without damage to nail, initial encounter: Secondary | ICD-10-CM

## 2014-02-16 LAB — CBC WITH DIFFERENTIAL/PLATELET
BASOS PCT: 1 % (ref 0–1)
Basophils Absolute: 0.1 10*3/uL (ref 0.0–0.1)
Eosinophils Absolute: 0.4 10*3/uL (ref 0.0–0.7)
Eosinophils Relative: 6 % — ABNORMAL HIGH (ref 0–5)
HEMATOCRIT: 39.7 % (ref 39.0–52.0)
HEMOGLOBIN: 13.6 g/dL (ref 13.0–17.0)
LYMPHS ABS: 1.4 10*3/uL (ref 0.7–4.0)
Lymphocytes Relative: 23 % (ref 12–46)
MCH: 30.3 pg (ref 26.0–34.0)
MCHC: 34.3 g/dL (ref 30.0–36.0)
MCV: 88.4 fL (ref 78.0–100.0)
MONO ABS: 0.4 10*3/uL (ref 0.1–1.0)
MONOS PCT: 7 % (ref 3–12)
NEUTROS ABS: 3.8 10*3/uL (ref 1.7–7.7)
Neutrophils Relative %: 63 % (ref 43–77)
Platelets: 250 10*3/uL (ref 150–400)
RBC: 4.49 MIL/uL (ref 4.22–5.81)
RDW: 13.5 % (ref 11.5–15.5)
WBC: 6.1 10*3/uL (ref 4.0–10.5)

## 2014-02-16 LAB — COMPLETE METABOLIC PANEL WITH GFR
ALBUMIN: 4.8 g/dL (ref 3.5–5.2)
ALK PHOS: 25 U/L — AB (ref 39–117)
ALT: 31 U/L (ref 0–53)
AST: 24 U/L (ref 0–37)
BUN: 23 mg/dL (ref 6–23)
CALCIUM: 9.7 mg/dL (ref 8.4–10.5)
CO2: 26 mEq/L (ref 19–32)
Chloride: 107 mEq/L (ref 96–112)
Creat: 1.57 mg/dL — ABNORMAL HIGH (ref 0.50–1.35)
GFR, Est African American: 58 mL/min — ABNORMAL LOW
GFR, Est Non African American: 50 mL/min — ABNORMAL LOW
GLUCOSE: 89 mg/dL (ref 70–99)
Potassium: 4.4 mEq/L (ref 3.5–5.3)
Sodium: 141 mEq/L (ref 135–145)
Total Bilirubin: 0.4 mg/dL (ref 0.2–1.2)
Total Protein: 7.3 g/dL (ref 6.0–8.3)

## 2014-02-16 LAB — LIPID PANEL
CHOLESTEROL: 134 mg/dL (ref 0–200)
HDL: 30 mg/dL — ABNORMAL LOW (ref >39)
LDL Cholesterol: 81 mg/dL (ref 0–99)
Total CHOL/HDL Ratio: 4.5 Ratio
Triglycerides: 115 mg/dL (ref <150)
VLDL: 23 mg/dL (ref 0–40)

## 2014-02-16 MED ORDER — PANTOPRAZOLE SODIUM 40 MG PO TBEC: 40.0000 mg | DELAYED_RELEASE_TABLET | Freq: Every day | ORAL | Status: DC

## 2014-02-16 NOTE — Progress Notes (Signed)
Subjective:    Patient ID: Francisco Gallagher, male    DOB: 1961/02/17, 53 y.o.   MRN: [id number]  02/16/2014  Follow-up, Medication Refill, Hyperlipidemia, Hypertension, Hypothyroidism and Gastrophageal Reflux   HPI This 53 y.o. male presents for six month follow-up:  1. Grief reaction: mother died 2013/11/23; mother had been declining; called pt at work with SOB.  Called EMS to take to ED; diagnosed with AMI.  Admitted to [facility].  Pt was HCPOA; made decision to withdraw from life support.  Said that mother dried up like a prune.  Father is doing OK; parents retired and moved in with patient.  Parents married x 50 years.  Hard three days.    2. HTN:  Blood pressure running 130/75-150/90.  Takes diuretic on weekends.  Will sometimes take HCTZ at 3:00pm.  Did not tolerate Amlodipine which caused swelling.  At nighttime, 130/85.  Patient reports good compliance with medication, good tolerance to medication, and good symptom control.     3.  Dyslipidemia:  No changes to management made at last visit. Patient reports good compliance with medication, good tolerance to medication, and good symptom control.  Denies HA/dizziness/focal weakness/paresthesias.  3.  Glucose Intolerance:  No changes to management made at last visit; some dietary modification; no exercise in past six months.  4.  Hypothyroidism:   No changes to management made at last visit.  Patient reports good compliance with medication, good tolerance to medication, and good symptom control.    5.  R third finger crush injury:  Occurred two weeks ago; dropped steel on finger.  Numbness along distal finger.   Slowly improving.   Review of Systems  Constitutional: Negative for fever, chills, diaphoresis, activity change, appetite change and fatigue.  Eyes: Negative.  Negative for visual disturbance.  Respiratory: Negative for cough and shortness of breath.   Cardiovascular: Negative for chest pain, palpitations and leg swelling.    Gastrointestinal: Negative for nausea, vomiting, abdominal pain, diarrhea and abdominal distention.  Endocrine: Negative for cold intolerance, heat intolerance, polydipsia, polyphagia and polyuria.  Musculoskeletal: Positive for arthralgias and joint swelling.  Skin: Positive for wound.  Neurological: Negative for dizziness, tremors, seizures, syncope, facial asymmetry, speech difficulty, weakness, light-headedness, numbness and headaches.  Psychiatric/Behavioral: Positive for dysphoric mood.    Past Medical History  Diagnosis Date  . Thyroid disease     per pt low thyroid  . Hyperlipidemia   . Hypertension   . Arthritis   . GERD (gastroesophageal reflux disease)   . Erectile dysfunction     Cialis PRN  . Hypogonadism male 08/23/2011    s/p urology consult [facility]; no treatment indicated due to potential of pregnancy.  . Glucose intolerance (impaired glucose tolerance)   . Renal insufficiency   . Multinodular goiter 07/23/2007    thyroid u/s: multinodular goiter; s/p ENT consult/[facility]:  Rx for Synthroid.  Marland Kitchen Unspecified hypothyroidism   . Tobacco use disorder   . Pain in joint, site unspecified   . Unspecified disorder of skin and subcutaneous tissue   . Mild sleep apnea    Past Surgical History  Procedure Laterality Date  . Cholecystectomy  2003  . Sleep study  07/23/2007  . Cardiac catheterization  07/23/1999    negative.  [place] Hals cyst  1981   Allergies  Allergen Reactions  . Penicillins Rash   Current Outpatient Prescriptions  Medication Sig Dispense Refill  . fenofibrate 160 MG tablet Take 1 tablet (160 mg total) by mouth daily.  [place]  tablet  3  . hydrochlorothiazide (HYDRODIURIL) 12.5 MG tablet Take 1 tablet (12.5 mg total) by mouth daily.  90 tablet  1  . levothyroxine (SYNTHROID, LEVOTHROID) 100 MCG tablet Take 1 tablet (100 mcg total) by mouth daily before breakfast.  90 tablet  3  . meloxicam (MOBIC) 15 MG tablet Take 1 tablet (15 mg total) by mouth  daily.  90 tablet  3  . pantoprazole (PROTONIX) 40 MG tablet Take 1 tablet (40 mg total) by mouth daily.  90 tablet  3  . tadalafil (CIALIS) 20 MG tablet Take 1 tablet (20 mg total) by mouth daily as needed.  8 tablet  11   No current facility-administered medications for this visit.       Objective:    BP 134/94  Pulse 62  Temp(Src) 98 F (36.7 C) (Oral)  Resp 16  Ht 6' (1.829 m)  Wt 218 lb (98.884 kg)  BMI 29.56 kg/m2  SpO2 96% Physical Exam  Nursing note and vitals reviewed. Constitutional: He is oriented to person, place, and time. He appears well-developed and well-nourished. No distress.  HENT:  Head: Normocephalic and atraumatic.  Right Ear: External ear normal.  Left Ear: External ear normal.  Nose: Nose normal.  Mouth/Throat: Oropharynx is clear and moist.  Eyes: Conjunctivae and EOM are normal. Pupils are equal, round, and reactive to light.  Neck: Normal range of motion. Neck supple. Carotid bruit is not present. No thyromegaly present.  Cardiovascular: Normal rate, regular rhythm, normal heart sounds and intact distal pulses.  Exam reveals no gallop and no friction rub.   No murmur heard. Pulmonary/Chest: Effort normal and breath sounds normal. He has no wheezes. He has no rales.  Abdominal: Soft. Bowel sounds are normal. He exhibits no distension and no mass. There is no tenderness. There is no rebound and no guarding.  Lymphadenopathy:    He has no cervical adenopathy.  Neurological: He is alert and oriented to person, place, and time. No cranial nerve deficit.  Skin: Skin is warm and dry. No rash noted. He is not diaphoretic.  Finger with 50% subungal hematoma; no swelling of distal finger; full ROM.  Psychiatric: He has a normal mood and affect. His behavior is normal.   Results for orders placed in visit on 02/16/14  CBC WITH DIFFERENTIAL      Result Value Ref Range   WBC 6.1  4.0 - 10.5 K/uL   RBC 4.49  4.22 - 5.81 MIL/uL   Hemoglobin 13.6  13.0 - 17.0  g/dL   HCT 39.7  39.0 - 52.0 %   MCV 88.4  78.0 - 100.0 fL   MCH 30.3  26.0 - 34.0 pg   MCHC 34.3  30.0 - 36.0 g/dL   RDW 13.5  11.5 - 15.5 %   Platelets 250  150 - 400 K/uL   Neutrophils Relative % 63  43 - 77 %   Neutro Abs 3.8  1.7 - 7.7 K/uL   Lymphocytes Relative 23  12 - 46 %   Lymphs Abs 1.4  0.7 - 4.0 K/uL   Monocytes Relative 7  3 - 12 %   Monocytes Absolute 0.4  0.1 - 1.0 K/uL   Eosinophils Relative 6 (*) 0 - 5 %   Eosinophils Absolute 0.4  0.0 - 0.7 K/uL   Basophils Relative 1  0 - 1 %   Basophils Absolute 0.1  0.0 - 0.1 K/uL   Smear Review Criteria for review not met  COMPLETE METABOLIC PANEL WITH GFR      Result Value Ref Range   Sodium 141  135 - 145 mEq/L   Potassium 4.4  3.5 - 5.3 mEq/L   Chloride 107  96 - 112 mEq/L   CO2 26  19 - 32 mEq/L   Glucose, Bld 89  70 - 99 mg/dL   BUN 23  6 - 23 mg/dL   Creat 1.57 (*) 0.50 - 1.35 mg/dL   Total Bilirubin 0.4  0.2 - 1.2 mg/dL   Alkaline Phosphatase 25 (*) 39 - 117 U/L   AST 24  0 - 37 U/L   ALT 31  0 - 53 U/L   Total Protein 7.3  6.0 - 8.3 g/dL   Albumin 4.8  3.5 - 5.2 g/dL   Calcium 9.7  8.4 - 10.5 mg/dL   GFR, Est African American 58 (*)    GFR, Est Non African American 50 (*)   HEMOGLOBIN A1C      Result Value Ref Range   Hemoglobin A1C 6.0 (*) <5.7 %   Mean Plasma Glucose 126 (*) <117 mg/dL  TSH      Result Value Ref Range   TSH 4.130  0.350 - 4.500 uIU/mL  LIPID PANEL      Result Value Ref Range   Cholesterol 134  0 - 200 mg/dL   Triglycerides 115  <150 mg/dL   HDL 30 (*) >39 mg/dL   Total CHOL/HDL Ratio 4.5     VLDL 23  0 - 40 mg/dL   LDL Cholesterol 81  0 - 99 mg/dL       Assessment & Plan:   1. Stress reaction   2. Hypothyroidism, unspecified hypothyroidism type   3. Hyperlipidemia   4. Gastroesophageal reflux disease, esophagitis presence not specified   5. Essential hypertension, benign   6. Glucose intolerance (impaired glucose tolerance)   7. Subungual contusion of fingernail,  initial encounter    1. Grief reaction: New. Secondary to death of mother; counseling provided during visit; coping well at this time. 2.  Hypothyroidism: controlled; obtain labs; no change to management. 3.  Hyperlipidemia: controlled; obtain labs; no change to management. 4.  GERD: uncontrolled due to non-compliance with medication for past week; refill provided. 5.  HTN: controlled; obtain labs; continue current medications; using HCTZ three days per week due to increase in urination while at work. 6.  Glucose intolerance: stable; obtain labs; continue with dietary modification. 7.  Subungal hematoma:  New.  Injury two weeks ago; supportive care at this time.  Meds ordered this encounter  Medications  . pantoprazole (PROTONIX) 40 MG tablet    Sig: Take 1 tablet (40 mg total) by mouth daily.    Dispense:  90 tablet    Refill:  3    Return in about 6 months (around 08/19/2014) for complete physical examiniation.    Reginia Forts, M.D.  [facility] [address] [place], [place]  [address] [phone] phone [phone] fax

## 2014-02-17 LAB — HEMOGLOBIN A1C
HEMOGLOBIN A1C: 6 % — AB (ref <5.7)
Mean Plasma Glucose: 126 mg/dL — ABNORMAL HIGH (ref <117)

## 2014-02-17 LAB — TSH: TSH: 4.13 u[IU]/mL (ref 0.350–4.500)

## 2014-02-20 ENCOUNTER — Encounter: Payer: Self-pay | Admitting: Family Medicine

## 2014-04-06 ENCOUNTER — Encounter: Payer: Self-pay | Admitting: Family Medicine

## 2014-04-06 ENCOUNTER — Ambulatory Visit (INDEPENDENT_AMBULATORY_CARE_PROVIDER_SITE_OTHER): Payer: 59 | Admitting: Family Medicine

## 2014-04-06 ENCOUNTER — Ambulatory Visit (INDEPENDENT_AMBULATORY_CARE_PROVIDER_SITE_OTHER): Payer: 59

## 2014-04-06 VITALS — BP 112/86 | HR 76 | Temp 98.2°F | Resp 16 | Ht 72.5 in | Wt 214.4 lb

## 2014-04-06 DIAGNOSIS — N181 Chronic kidney disease, stage 1: Secondary | ICD-10-CM

## 2014-04-06 DIAGNOSIS — S86912A Strain of unspecified muscle(s) and tendon(s) at lower leg level, left leg, initial encounter: Secondary | ICD-10-CM

## 2014-04-06 DIAGNOSIS — M25662 Stiffness of left knee, not elsewhere classified: Secondary | ICD-10-CM

## 2014-04-06 DIAGNOSIS — M25669 Stiffness of unspecified knee, not elsewhere classified: Secondary | ICD-10-CM

## 2014-04-06 DIAGNOSIS — IMO0002 Reserved for concepts with insufficient information to code with codable children: Secondary | ICD-10-CM

## 2014-04-06 MED ORDER — PREDNISONE 20 MG PO TABS: ORAL_TABLET | ORAL | Status: DC

## 2014-04-06 NOTE — Progress Notes (Signed)
Subjective:    Patient ID: Francisco Gallagher, male    DOB: 12/08/1960, 53 y.o.   MRN: [id number]  This chart was scribed for Wardell Honour, MD by Edison Simon, ED Scribe. This patient was seen in room 22 and the patient's care was started at 11:41 AM.   04/06/2014  Knee Pain  HPI HPI Comments: Francisco Gallagher is a 53 y.o. male who presents to the [facility] complaining of left knee pain with onset last week with associated stiffness. He denies significant swelling. He denies popping in the knee or the knee giving out. He denies pain at the kneecap. He reports taking Tylenol and using sports cream without remission. He reports chronic ache for 2 years to both knees because he is on his feet all day at work; he denies ever having X-rays of his knees done. He denies recent knee injury or strain at work. He states he wears steel toed shoes with Dr. Felicie Morn inserts. He states he has stopped Meloxicam as per instructions due to renal insufficiency with Creatinine of 1.54.  Review of Systems  Constitutional: Negative for fever, chills, diaphoresis and fatigue.  Musculoskeletal: Positive for arthralgias (left knee) and gait problem. Negative for joint swelling.  Skin: Negative for color change, pallor, rash and wound.  Neurological: Negative for weakness and numbness.    Past Medical History  Diagnosis Date  . Thyroid disease     per pt low thyroid  . Hyperlipidemia   . Hypertension   . Arthritis   . GERD (gastroesophageal reflux disease)   . Erectile dysfunction     Cialis PRN  . Hypogonadism male 08/23/2011    s/p urology consult [facility]; no treatment indicated due to potential of pregnancy.  . Glucose intolerance (impaired glucose tolerance)   . Renal insufficiency   . Multinodular goiter 07/23/2007    thyroid u/s: multinodular goiter; s/p ENT consult/[facility]:  Rx for Synthroid.  Marland Kitchen Unspecified hypothyroidism   . Tobacco use disorder   . Pain in joint, site  unspecified   . Unspecified disorder of skin and subcutaneous tissue   . Mild sleep apnea    Past Surgical History  Procedure Laterality Date  . Cholecystectomy  2003  . Sleep study  07/23/2007  . Cardiac catheterization  07/23/1999    negative.  [place] Hals cyst  1981   Allergies  Allergen Reactions  . Penicillins Rash   Current Outpatient Prescriptions  Medication Sig Dispense Refill  . fenofibrate 160 MG tablet Take 1 tablet (160 mg total) by mouth daily.  90 tablet  3  . hydrochlorothiazide (HYDRODIURIL) 12.5 MG tablet Take 1 tablet (12.5 mg total) by mouth daily.  90 tablet  1  . levothyroxine (SYNTHROID, LEVOTHROID) 100 MCG tablet Take 1 tablet (100 mcg total) by mouth daily before breakfast.  90 tablet  3  . meloxicam (MOBIC) 15 MG tablet Take 1 tablet (15 mg total) by mouth daily.  90 tablet  3  . pantoprazole (PROTONIX) 40 MG tablet Take 1 tablet (40 mg total) by mouth daily.  90 tablet  3  . tadalafil (CIALIS) 20 MG tablet Take 1 tablet (20 mg total) by mouth daily as needed.  8 tablet  11  . predniSONE (DELTASONE) 20 MG tablet Three tablets daily x 1 day, then two tablets daily x 5 days, then one tablet daily x 4 days  17 tablet  0   No current facility-administered medications for this visit.   History  Social History  . Marital Status: Single    Spouse Name: N/A    Number of Children: 0  . Years of Education: N/A   Occupational History  . 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 Smoking status: Never Smoker   . Smokeless tobacco: Current User     Comment: PATIENT CHEWS TOBACCO  20 years  . 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 Drug Use: No  . Sexual Activity: Yes   Other Topics Concern  . Not on file   Social History Narrative   Marital status: single; girlfriend passed in 02/2013 of breast cancer age 89.      Children: none      Lives: with father; mother passed away 11-02-13.       Employment:  Works at [employer] in [place] x 26 years; happy      Tobacco:  Chews tobacco x 24 years      Alcohol:  5 times per year at [employer]      Drugs:  None      Exercise: sporadic      Seatbelt:  50% of time      Guns: loaded secured guns in home.       Sexual activity: sexually active; no STDs; total sexual partners < 10.        Smoke alarm and carbon monoxide detector in the home.      Caffeine use: carbonated beverages, moderate amount.        Objective:    BP 112/86  Pulse 76  Temp(Src) 98.2 F (36.8 C) (Oral)  Resp 16  Ht 6' 0.5" (1.842 m)  Wt 214 lb 6.4 oz (97.251 kg)  BMI 28.66 kg/m2  SpO2 96% Physical Exam  Nursing note and vitals reviewed. Constitutional: He is oriented to person, place, and time. He appears well-developed and well-nourished. No distress.  HENT:  Head: Normocephalic and atraumatic.  Eyes: Conjunctivae are normal.  Neck: Normal range of motion. Neck supple.  Pulmonary/Chest: Effort normal.  Musculoskeletal: Normal range of motion.       Left knee: He exhibits swelling, effusion and bony tenderness. He exhibits normal range of motion, no ecchymosis, no deformity, no laceration, no erythema, normal patellar mobility, normal meniscus and no MCL laxity. Tenderness found. Medial joint line tenderness noted. No lateral joint line and no patellar tendon tenderness noted.  Left knee tender to palpation medial jointline, patella nontender, mild joint effusion, Lochman negative, anterior drawer negative, no calf tenderness or swelling  Neurological: He is alert and oriented to person, place, and time.  Skin: Skin is warm and dry. He is not diaphoretic.  Psychiatric: He has a normal mood and affect.   Results for orders placed in visit on 02/16/14  CBC WITH DIFFERENTIAL      Result Value Ref Range   WBC 6.1  4.0 - 10.5 K/uL   RBC 4.49  4.22 - 5.81 MIL/uL   Hemoglobin 13.6  13.0 - 17.0 g/dL   HCT 39.7  39.0 - 52.0 %   MCV 88.4  78.0 -  100.0 fL   MCH 30.3  26.0 - 34.0 pg   MCHC 34.3  30.0 - 36.0 g/dL   RDW 13.5  11.5 - 15.5 %   Platelets 250  150 - 400 K/uL   Neutrophils Relative % 63  43 -  77 %   Neutro Abs 3.8  1.7 - 7.7 K/uL   Lymphocytes Relative 23  12 - 46 %   Lymphs Abs 1.4  0.7 - 4.0 K/uL   Monocytes Relative 7  3 - 12 %   Monocytes Absolute 0.4  0.1 - 1.0 K/uL   Eosinophils Relative 6 (*) 0 - 5 %   Eosinophils Absolute 0.4  0.0 - 0.7 K/uL   Basophils Relative 1  0 - 1 %   Basophils Absolute 0.1  0.0 - 0.1 K/uL   Smear Review Criteria for review not met    COMPLETE METABOLIC PANEL WITH GFR      Result Value Ref Range   Sodium 141  135 - 145 mEq/L   Potassium 4.4  3.5 - 5.3 mEq/L   Chloride 107  96 - 112 mEq/L   CO2 26  19 - 32 mEq/L   Glucose, Bld 89  70 - 99 mg/dL   BUN 23  6 - 23 mg/dL   Creat 1.57 (*) 0.50 - 1.35 mg/dL   Total Bilirubin 0.4  0.2 - 1.2 mg/dL   Alkaline Phosphatase 25 (*) 39 - 117 U/L   AST 24  0 - 37 U/L   ALT 31  0 - 53 U/L   Total Protein 7.3  6.0 - 8.3 g/dL   Albumin 4.8  3.5 - 5.2 g/dL   Calcium 9.7  8.4 - 10.5 mg/dL   GFR, Est African American 58 (*)    GFR, Est Non African American 50 (*)   HEMOGLOBIN A1C      Result Value Ref Range   Hemoglobin A1C 6.0 (*) <5.7 %   Mean Plasma Glucose 126 (*) <117 mg/dL  TSH      Result Value Ref Range   TSH 4.130  0.350 - 4.500 uIU/mL  LIPID PANEL      Result Value Ref Range   Cholesterol 134  0 - 200 mg/dL   Triglycerides 115  <150 mg/dL   HDL 30 (*) >39 mg/dL   Total CHOL/HDL Ratio 4.5     VLDL 23  0 - 40 mg/dL   LDL Cholesterol 81  0 - 99 mg/dL   [facility] reading (PRIMARY) by  Dr. Tamala Julian.  L KNEE: mild patellar spurring; NAD      Assessment & Plan:   1. Knee stiffness, left   2. Knee strain, left, initial encounter   3. Chronic renal insufficiency, stage 1     1. L knee stiffness/strain:  New.  Rx for Prednisone provided; recommend rest, ice, exercises which were provided.  If no improvement in two weeks, call for ortho  referral. 2.  Renal insufficiency: persistent; stopped Mobic at last visit; pt declined restarting medication/Mobic for knee pain; desires trial of prednisone.   Meds ordered this encounter  Medications  . predniSONE (DELTASONE) 20 MG tablet    Sig: Three tablets daily x 1 day, then two tablets daily x 5 days, then one tablet daily x 4 days    Dispense:  17 tablet    Refill:  0    No Follow-up on file.   I personally performed the services described in this documentation, which was scribed in my presence.  The recorded information has been reviewed and is accurate.    Reginia Forts, M.D.  [facility] [address] [place], [place]  [phone] phone [phone] fax

## 2014-04-06 NOTE — Patient Instructions (Signed)
Knee Exercises EXERCISES RANGE OF MOTION (ROM) AND STRETCHING EXERCISES These exercises may help you when beginning to rehabilitate your injury. Your symptoms may resolve with or without further involvement from your physician, physical therapist, or athletic trainer. While completing these exercises, remember:   Restoring tissue flexibility helps normal motion to return to the joints. This allows healthier, less painful movement and activity.  An effective stretch should be held for at least 30 seconds.  A stretch should never be painful. You should only feel a gentle lengthening or release in the stretched tissue. STRETCH - Knee Extension, Prone  Lie on your stomach on a firm surface, such as a bed or countertop. Place your right / left knee and leg just beyond the edge of the surface. You may wish to place a towel under the far end of your right / left thigh for comfort.  Relax your leg muscles and allow gravity to straighten your knee. Your clinician may advise you to add an ankle weight if more resistance is helpful for you.  You should feel a stretch in the back of your right / left knee. Hold this position for __________ seconds. Repeat __________ times. Complete this stretch __________ times per day. * Your physician, physical therapist, or athletic trainer may ask you to add ankle weight to enhance your stretch.  RANGE OF MOTION - Knee Flexion, Active  Lie on your back with both knees straight. (If this causes back discomfort, bend your opposite knee, placing your foot flat on the floor.)  Slowly slide your heel back toward your buttocks until you feel a gentle stretch in the front of your knee or thigh.  Hold for __________ seconds. Slowly slide your heel back to the starting position. Repeat __________ times. Complete this exercise __________ times per day.  STRETCH - Quadriceps, Prone   Lie on your stomach on a firm surface, such as a bed or padded floor.  Bend your right /  left knee and grasp your ankle. If you are unable to reach your ankle or pant leg, use a belt around your foot to lengthen your reach.  Gently pull your heel toward your buttocks. Your knee should not slide out to the side. You should feel a stretch in the front of your thigh and/or knee.  Hold this position for __________ seconds. Repeat __________ times. Complete this stretch __________ times per day.  STRETCH - Hamstrings, Supine   Lie on your back. Loop a belt or towel over the ball of your right / left foot.  Straighten your right / left knee and slowly pull on the belt to raise your leg. Do not allow the right / left knee to bend. Keep your opposite leg flat on the floor.  Raise the leg until you feel a gentle stretch behind your right / left knee or thigh. Hold this position for __________ seconds. Repeat __________ times. Complete this stretch __________ times per day.  STRENGTHENING EXERCISES These exercises may help you when beginning to rehabilitate your injury. They may resolve your symptoms with or without further involvement from your physician, physical therapist, or athletic trainer. While completing these exercises, remember:   Muscles can gain both the endurance and the strength needed for everyday activities through controlled exercises.  Complete these exercises as instructed by your physician, physical therapist, or athletic trainer. Progress the resistance and repetitions only as guided.  You may experience muscle soreness or fatigue, but the pain or discomfort you are trying to eliminate   should never worsen during these exercises. If this pain does worsen, stop and make certain you are following the directions exactly. If the pain is still present after adjustments, discontinue the exercise until you can discuss the trouble with your clinician. STRENGTH - Quadriceps, Isometrics  Lie on your back with your right / left leg extended and your opposite knee  bent.  Gradually tense the muscles in the front of your right / left thigh. You should see either your knee cap slide up toward your hip or increased dimpling just above the knee. This motion will push the back of the knee down toward the floor/mat/bed on which you are lying.  Hold the muscle as tight as you can without increasing your pain for __________ seconds.  Relax the muscles slowly and completely in between each repetition. Repeat __________ times. Complete this exercise __________ times per day.  STRENGTH - Quadriceps, Short Arcs   Lie on your back. Place a __________ inch towel roll under your knee so that the knee slightly bends.  Raise only your lower leg by tightening the muscles in the front of your thigh. Do not allow your thigh to rise.  Hold this position for __________ seconds. Repeat __________ times. Complete this exercise __________ times per day.  OPTIONAL ANKLE WEIGHTS: Begin with ____________________, but DO NOT exceed ____________________. Increase in 1 pound/0.5 kilogram increments.  STRENGTH - Quadriceps, Straight Leg Raises  Quality counts! Watch for signs that the quadriceps muscle is working to insure you are strengthening the correct muscles and not "cheating" by substituting with healthier muscles.  Lay on your back with your right / left leg extended and your opposite knee bent.  Tense the muscles in the front of your right / left thigh. You should see either your knee cap slide up or increased dimpling just above the knee. Your thigh may even quiver.  Tighten these muscles even more and raise your leg 4 to 6 inches off the floor. Hold for __________ seconds.  Keeping these muscles tense, lower your leg.  Relax the muscles slowly and completely in between each repetition. Repeat __________ times. Complete this exercise __________ times per day.  STRENGTH - Hamstring, Curls  Lay on your stomach with your legs extended. (If you lay on a bed, your feet  may hang over the edge.)  Tighten the muscles in the back of your thigh to bend your right / left knee up to 90 degrees. Keep your hips flat on the bed/floor.  Hold this position for __________ seconds.  Slowly lower your leg back to the starting position. Repeat __________ times. Complete this exercise __________ times per day.  OPTIONAL ANKLE WEIGHTS: Begin with ____________________, but DO NOT exceed ____________________. Increase in 1 pound/0.5 kilogram increments.  STRENGTH - Quadriceps, Squats  Stand in a door frame so that your feet and knees are in line with the frame.  Use your hands for balance, not support, on the frame.  Slowly lower your weight, bending at the hips and knees. Keep your lower legs upright so that they are parallel with the door frame. Squat only within the range that does not increase your knee pain. Never let your hips drop below your knees.  Slowly return upright, pushing with your legs, not pulling with your hands. Repeat __________ times. Complete this exercise __________ times per day.  STRENGTH - Quadriceps, Wall Slides  Follow guidelines for form closely. Increased knee pain often results from poorly placed feet or knees.  Lean   against a smooth wall or door and walk your feet out 18-24 inches. Place your feet hip-width apart.  Slowly slide down the wall or door until your knees bend __________ degrees.* Keep your knees over your heels, not your toes, and in line with your hips, not falling to either side.  Hold for __________ seconds. Stand up to rest for __________ seconds in between each repetition. Repeat __________ times. Complete this exercise __________ times per day. * Your physician, physical therapist, or athletic trainer will alter this angle based on your symptoms and progress. Document Released: 05/22/2005 Document Revised: 11/22/2013 Document Reviewed: 10/20/2008 Exit[employer] Patient Information 2015 [employer]. This information is not  intended to replace advice given to you by your health care provider. Make sure you discuss any questions you have with your health care provider.  

## 2014-04-13 ENCOUNTER — Ambulatory Visit: Payer: 59 | Admitting: Family Medicine

## 2014-04-29 ENCOUNTER — Other Ambulatory Visit: Payer: Self-pay | Admitting: Family Medicine

## 2014-05-30 ENCOUNTER — Other Ambulatory Visit: Payer: Self-pay | Admitting: Family Medicine

## 2014-08-08 ENCOUNTER — Ambulatory Visit (INDEPENDENT_AMBULATORY_CARE_PROVIDER_SITE_OTHER): Payer: 59 | Admitting: Family Medicine

## 2014-08-08 ENCOUNTER — Encounter: Payer: Self-pay | Admitting: Family Medicine

## 2014-08-08 ENCOUNTER — Ambulatory Visit (INDEPENDENT_AMBULATORY_CARE_PROVIDER_SITE_OTHER): Payer: 59

## 2014-08-08 VITALS — BP 125/83 | HR 79 | Temp 98.1°F | Resp 16 | Ht 73.0 in | Wt 218.0 lb

## 2014-08-08 DIAGNOSIS — R7302 Impaired glucose tolerance (oral): Secondary | ICD-10-CM

## 2014-08-08 DIAGNOSIS — N529 Male erectile dysfunction, unspecified: Secondary | ICD-10-CM

## 2014-08-08 DIAGNOSIS — I10 Essential (primary) hypertension: Secondary | ICD-10-CM

## 2014-08-08 DIAGNOSIS — E039 Hypothyroidism, unspecified: Secondary | ICD-10-CM

## 2014-08-08 DIAGNOSIS — Z125 Encounter for screening for malignant neoplasm of prostate: Secondary | ICD-10-CM

## 2014-08-08 DIAGNOSIS — K219 Gastro-esophageal reflux disease without esophagitis: Secondary | ICD-10-CM

## 2014-08-08 DIAGNOSIS — E785 Hyperlipidemia, unspecified: Secondary | ICD-10-CM

## 2014-08-08 DIAGNOSIS — R0600 Dyspnea, unspecified: Secondary | ICD-10-CM

## 2014-08-08 DIAGNOSIS — Z0000 Encounter for general adult medical examination without abnormal findings: Secondary | ICD-10-CM

## 2014-08-08 DIAGNOSIS — Z1211 Encounter for screening for malignant neoplasm of colon: Secondary | ICD-10-CM

## 2014-08-08 LAB — POCT URINALYSIS DIPSTICK
Bilirubin, UA: NEGATIVE
Glucose, UA: NEGATIVE
KETONES UA: NEGATIVE
Leukocytes, UA: NEGATIVE
Nitrite, UA: NEGATIVE
PH UA: 6
Spec Grav, UA: 1.015
UROBILINOGEN UA: 0.2

## 2014-08-08 LAB — LIPID PANEL
CHOL/HDL RATIO: 5.1 ratio
CHOLESTEROL: 175 mg/dL (ref 0–200)
HDL: 34 mg/dL — ABNORMAL LOW (ref >39)
LDL Cholesterol: 118 mg/dL — ABNORMAL HIGH (ref 0–99)
TRIGLYCERIDES: 117 mg/dL (ref <150)
VLDL: 23 mg/dL (ref 0–40)

## 2014-08-08 LAB — COMPREHENSIVE METABOLIC PANEL
ALBUMIN: 4.9 g/dL (ref 3.5–5.2)
ALT: 24 U/L (ref 0–53)
AST: 17 U/L (ref 0–37)
Alkaline Phosphatase: 35 U/L — ABNORMAL LOW (ref 39–117)
BUN: 19 mg/dL (ref 6–23)
CO2: 26 mEq/L (ref 19–32)
CREATININE: 1.49 mg/dL — AB (ref 0.50–1.35)
Calcium: 10 mg/dL (ref 8.4–10.5)
Chloride: 100 mEq/L (ref 96–112)
Glucose, Bld: 108 mg/dL — ABNORMAL HIGH (ref 70–99)
Potassium: 4.8 mEq/L (ref 3.5–5.3)
SODIUM: 136 meq/L (ref 135–145)
Total Bilirubin: 0.6 mg/dL (ref 0.2–1.2)
Total Protein: 7.7 g/dL (ref 6.0–8.3)

## 2014-08-08 LAB — CBC WITH DIFFERENTIAL/PLATELET
BASOS ABS: 0.1 10*3/uL (ref 0.0–0.1)
BASOS PCT: 1 % (ref 0–1)
EOS ABS: 0.2 10*3/uL (ref 0.0–0.7)
Eosinophils Relative: 3 % (ref 0–5)
HCT: 46.2 % (ref 39.0–52.0)
Hemoglobin: 15.9 g/dL (ref 13.0–17.0)
Lymphocytes Relative: 19 % (ref 12–46)
Lymphs Abs: 1.3 10*3/uL (ref 0.7–4.0)
MCH: 31.1 pg (ref 26.0–34.0)
MCHC: 34.4 g/dL (ref 30.0–36.0)
MCV: 90.2 fL (ref 78.0–100.0)
MONOS PCT: 7 % (ref 3–12)
MPV: 9.9 fL (ref 8.6–12.4)
Monocytes Absolute: 0.5 10*3/uL (ref 0.1–1.0)
Neutro Abs: 4.9 10*3/uL (ref 1.7–7.7)
Neutrophils Relative %: 70 % (ref 43–77)
PLATELETS: 318 10*3/uL (ref 150–400)
RBC: 5.12 MIL/uL (ref 4.22–5.81)
RDW: 13.2 % (ref 11.5–15.5)
WBC: 7 10*3/uL (ref 4.0–10.5)

## 2014-08-08 LAB — IFOBT (OCCULT BLOOD): IMMUNOLOGICAL FECAL OCCULT BLOOD TEST: NEGATIVE

## 2014-08-08 LAB — PSA: PSA: 2.86 ng/mL (ref <=4.00)

## 2014-08-08 LAB — TSH: TSH: 3.552 u[IU]/mL (ref 0.350–4.500)

## 2014-08-08 LAB — HEMOGLOBIN A1C
Hgb A1c MFr Bld: 6 % — ABNORMAL HIGH (ref <5.7)
Mean Plasma Glucose: 126 mg/dL — ABNORMAL HIGH (ref <117)

## 2014-08-08 MED ORDER — HYDROCHLOROTHIAZIDE 12.5 MG PO TABS: 12.5000 mg | ORAL_TABLET | Freq: Every day | ORAL | Status: DC

## 2014-08-08 MED ORDER — FENOFIBRATE 160 MG PO TABS: 160.0000 mg | ORAL_TABLET | Freq: Every day | ORAL | Status: DC

## 2014-08-08 MED ORDER — IRBESARTAN 300 MG PO TABS: ORAL_TABLET | ORAL | Status: DC

## 2014-08-08 MED ORDER — PANTOPRAZOLE SODIUM 40 MG PO TBEC: 40.0000 mg | DELAYED_RELEASE_TABLET | Freq: Every day | ORAL | Status: DC

## 2014-08-08 MED ORDER — LEVOTHYROXINE SODIUM 100 MCG PO TABS: ORAL_TABLET | ORAL | Status: DC

## 2014-08-08 MED ORDER — TADALAFIL 20 MG PO TABS: 20.0000 mg | ORAL_TABLET | Freq: Every day | ORAL | Status: DC | PRN

## 2014-08-08 NOTE — Patient Instructions (Signed)
1. Start aspirin 81mg  one tablet daily for heart attack and stroke prevention.  Keeping you healthy  Get these tests  Blood pressure- Have your blood pressure checked once a year by your healthcare provider.  Normal blood pressure is 120/80  Weight- Have your body mass index (BMI) calculated to screen for obesity.  BMI is a measure of body fat based on height and weight. You can also calculate your own BMI at ViewBanking.si.  Cholesterol- Have your cholesterol checked every year.  Diabetes- Have your blood sugar checked regularly if you have high blood pressure, high cholesterol, have a family history of diabetes or if you are overweight.  Screening for Colon Cancer- Colonoscopy starting at age 39.  Screening may begin sooner depending on your family history and other health conditions. Follow up colonoscopy as directed by your Gastroenterologist.  Screening for Prostate Cancer- Both blood work (PSA) and a rectal exam help screen for Prostate Cancer.  Screening begins at age 66 with African-American men and at age 2 with Caucasian men.  Screening may begin sooner depending on your family history.  Take these medicines  Aspirin- One aspirin daily can help prevent Heart disease and Stroke.  Flu shot- Every fall.  Tetanus- Every 10 years.  Zostavax- Once after the age of 60 to prevent Shingles.  Pneumonia shot- Once after the age of 27; if you are younger than 68, ask your healthcare provider if you need a Pneumonia shot.  Take these steps  Don't smoke- If you do smoke, talk to your doctor about quitting.  For tips on how to quit, go to www.smokefree.gov or call 1-800-QUIT-NOW.  Be physically active- Exercise 5 days a week for at least 30 minutes.  If you are not already physically active start slow and gradually work up to 30 minutes of moderate physical activity.  Examples of moderate activity include walking briskly, mowing the yard, dancing, swimming, bicycling,  etc.  Eat a healthy diet- Eat a variety of healthy food such as fruits, vegetables, low fat milk, low fat cheese, yogurt, lean meant, poultry, fish, beans, tofu, etc. For more information go to www.thenutritionsource.org  Drink alcohol in moderation- Limit alcohol intake to less than two drinks a day. Never drink and drive.  Dentist- Brush and floss twice daily; visit your dentist twice a year.  Depression- Your emotional health is as important as your physical health. If you're feeling down, or losing interest in things you would normally enjoy please talk to your healthcare provider.  Eye exam- Visit your eye doctor every year.  Safe sex- If you may be exposed to a sexually transmitted infection, use a condom.  Seat belts- Seat belts can save your life; always wear one.  Smoke/Carbon Monoxide detectors- These detectors need to be installed on the appropriate level of your home.  Replace batteries at least once a year.  Skin cancer- When out in the sun, cover up and use sunscreen 15 SPF or higher.  Violence- If anyone is threatening you, please tell your healthcare provider.  Living Will/ Health care power of attorney- Speak with your healthcare provider and family.

## 2014-08-08 NOTE — Progress Notes (Signed)
Subjective:    Patient ID: Francisco Gallagher, male    DOB: March 10, 1961, 54 y.o.   MRN: [id number]  HPI This 54 y.o. male presents for Complete Physical Examination.  Last physical:  08/16/2013 Colonoscopy:  Agreeable in the fall.   TDAP:  2009 Influenza:  refuses Eye exam:  scheduled today but had to reschedule; +glasses; no gluacoma or cataract. Dental exam:  Every six months; [facility].     DOE: with exertion; +fatigue; +decreased energy.  Fine at work.  Short winded with exercise or if doing something quickly.  No chest pain.  No diaphoresis or nauseated.  Last stress test in 2001.  S/p cardiac catheterization in 2001.  No formal exercise in 2012.  Mother with AMI with cardiac stenting age 30.  Review of Systems  Constitutional: Negative for fever, chills, diaphoresis, activity change, appetite change, fatigue and unexpected weight change.  HENT: Positive for hearing loss and tinnitus. Negative for congestion, dental problem, drooling, ear discharge, ear pain, facial swelling, mouth sores, nosebleeds, postnasal drip, rhinorrhea, sinus pressure, sneezing, sore throat, trouble swallowing and voice change.   Eyes: Negative for photophobia, pain, discharge, redness, itching and visual disturbance.  Respiratory: Positive for shortness of breath. Negative for apnea, cough, choking, chest tightness, wheezing and stridor.   Cardiovascular: Negative for chest pain, palpitations and leg swelling.  Gastrointestinal: Negative for nausea, vomiting, abdominal pain, diarrhea, constipation and blood in stool.  Endocrine: Negative for cold intolerance, heat intolerance, polydipsia, polyphagia and polyuria.  Genitourinary: Negative for dysuria, urgency, frequency, hematuria, flank pain, decreased urine volume, discharge, penile swelling, scrotal swelling, enuresis, difficulty urinating, genital sores, penile pain and testicular pain.  Musculoskeletal: Positive for arthralgias. Negative for myalgias, back pain,  joint swelling, gait problem, neck pain and neck stiffness.  Skin: Negative for color change, pallor, rash and wound.  Allergic/Immunologic: Negative for environmental allergies, food allergies and immunocompromised state.  Neurological: Negative for dizziness, tremors, seizures, syncope, facial asymmetry, speech difficulty, weakness, light-headedness, numbness and headaches.  Hematological: Negative for adenopathy. Does not bruise/bleed easily.  Psychiatric/Behavioral: Negative for suicidal ideas, hallucinations, behavioral problems, confusion, sleep disturbance, self-injury, dysphoric mood, decreased concentration and agitation. The patient is not nervous/anxious and is not hyperactive.    Past Medical History  Diagnosis Date  . Thyroid disease     per pt low thyroid  . Hyperlipidemia   . Hypertension   . Arthritis   . GERD (gastroesophageal reflux disease)   . Erectile dysfunction     Cialis PRN  . Hypogonadism male 08/23/2011    s/p urology consult [facility]; no treatment indicated due to potential of pregnancy.  . Glucose intolerance (impaired glucose tolerance)   . Renal insufficiency   . Multinodular goiter 07/23/2007    thyroid u/s: multinodular goiter; s/p ENT consult/[facility]:  Rx for Synthroid.  Marland Kitchen Unspecified hypothyroidism   . Tobacco use disorder   . Pain in joint, site unspecified   . Unspecified disorder of skin and subcutaneous tissue   . Mild sleep apnea    Past Surgical History  Procedure Laterality Date  . Cholecystectomy  2003  . Sleep study  07/23/2007  . Cardiac catheterization  07/23/1999    negative.  [place] Hals cyst  1981   Allergies  Allergen Reactions  . Penicillins Rash   Current Outpatient Prescriptions on File Prior to Visit  Medication Sig Dispense Refill  . fenofibrate 160 MG tablet Take 1 tablet (160 mg total) by mouth daily. 90 tablet 3  . hydrochlorothiazide (HYDRODIURIL) 12.5  MG tablet Take 1 tablet (12.5 mg total) by mouth daily. 90  tablet 1  . irbesartan (AVAPRO) 300 MG tablet TAKE 1 TABLET (300 MG TOTAL) BY MOUTH DAILY. 90 tablet 3  . levothyroxine (SYNTHROID, LEVOTHROID) 100 MCG tablet TAKE 1 TABLET (100 MCG TOTAL) BY MOUTH DAILY BEFORE BREAKFAST 90 tablet 0  . pantoprazole (PROTONIX) 40 MG tablet Take 1 tablet (40 mg total) by mouth daily. 90 tablet 3  . tadalafil (CIALIS) 20 MG tablet Take 1 tablet (20 mg total) by mouth daily as needed. 8 tablet 11   No current facility-administered medications on file prior to visit.   History   Social History  . Marital Status: Single    Spouse Name: N/A    Number of Children: 0  . Years of Education: N/A   Occupational History  . 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 Smoking status: Never Smoker   . Smokeless tobacco: Current User     Comment: PATIENT CHEWS TOBACCO  20 years  . 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 Drug Use: No  . Sexual Activity: Yes   Other Topics Concern  . Not on file   Social History Narrative   Marital status: single; girlfriend passed in 02/2013 of breast cancer age 63.        Children: none      Lives: with father; mother passed away 11/23/2013.      Employment:  Works at [employer] in [place] x 27 years; happy      Tobacco:  Chews tobacco x 25 years      Alcohol:  5 times per year at [employer]      Drugs:  None      Exercise: sporadic; bicycle stationary sporadically      Seatbelt:  50% of time      Guns: loaded secured guns in home.       Sexual activity: sexually active; no STDs; total sexual partners < 10.        Smoke alarm and carbon monoxide detector in the home.      Caffeine use: carbonated beverages, moderate amount.   Family History  Problem Relation Age of Onset  . Arthritis Mother   . Hypertension Mother   . COPD Mother   . Depression Mother   . Fibromyalgia Mother   . Gout Mother   . Kidney disease Mother   . Cancer Mother     lung  .  Heart disease Mother 47    AMI s/p stenting  . Hypertension Father   . Benign prostatic hyperplasia Father   . Rosacea Father   . Arthritis Brother   . Benign prostatic hyperplasia Brother   . Heart disease Maternal Grandfather   . Hypertension Paternal Grandmother   . Stroke Paternal Grandfather   . Diabetes          Objective:   Physical Exam  Constitutional: He is oriented to person, place, and time. He appears well-developed and well-nourished. No distress.  HENT:  Head: Normocephalic and atraumatic.  Right Ear: External ear normal.  Left Ear: External ear normal.  Nose: Nose normal.  Mouth/Throat: Oropharynx is clear and moist.  Eyes: Conjunctivae and EOM are normal. Pupils are equal, round, and reactive to light.  Neck: Normal range of motion. Neck supple. Carotid bruit is not present.  No thyromegaly present.  Cardiovascular: Normal rate, regular rhythm, normal heart sounds and intact distal pulses.  Exam reveals no gallop and no friction rub.   No murmur heard. Pulmonary/Chest: Effort normal and breath sounds normal. He has no wheezes. He has no rales.  Abdominal: Soft. Bowel sounds are normal. He exhibits no distension and no mass. There is no tenderness. There is no rebound and no guarding. Hernia confirmed negative in the right inguinal area and confirmed negative in the left inguinal area.  Genitourinary: Rectum normal, testes normal and penis normal. Prostate is enlarged. Prostate is not tender. Right testis shows no mass, no swelling and no tenderness. Left testis shows no mass, no swelling and no tenderness.  Musculoskeletal:       Right shoulder: Normal.       Left shoulder: Normal.       Cervical back: Normal.  Lymphadenopathy:    He has no cervical adenopathy.       Right: No inguinal adenopathy present.       Left: No inguinal adenopathy present.  Neurological: He is alert and oriented to person, place, and time. He has normal reflexes. No cranial nerve  deficit. He exhibits normal muscle tone. Coordination normal.  Skin: Skin is warm and dry. No rash noted. He is not diaphoretic.  Psychiatric: He has a normal mood and affect. His behavior is normal. Judgment and thought content normal.   EKG: NSR; no ST changes.  [facility] reading (PRIMARY) by  Dr. Tamala Julian. CXR:  NAD  Results for orders placed or performed in visit on 08/08/14  POCT urinalysis dipstick  Result Value Ref Range   Color, UA yellow    Clarity, UA clear    Glucose, UA neg    Bilirubin, UA neg    Ketones, UA neg    Spec Grav, UA 1.015    Blood, UA trace    pH, UA 6.0    Protein, UA trace    Urobilinogen, UA 0.2    Nitrite, UA neg    Leukocytes, UA Negative   IFOBT POC (occult bld, rslt in office)  Result Value Ref Range   IFOBT Negative        Assessment & Plan:  1. Routine physical examination -Anticipatory guidance provided --- nicotine cessation; start ASA 81mg  daily; recommend regular exercise. -Immunizations reviewed; pt refused flu vaccine.   -Pt agreeable to scheduling colonoscopy in fall 2016.  2. Dyslipidemia -Controlled. Obtain labs; refill of medications provided. - Comprehensive metabolic panel - Hemoglobin A1c - Lipid panel  3. Glucose intolerance (impaired glucose tolerance) -Controlled with dietary modification; obtain labs; continue with dietary modification. - TSH  4. Hypothyroidism, unspecified hypothyroidism type -Controlled; obtain labs; refills provided. - CBC with Differential - TSH  5. Essential hypertension, benign -Controlled; obtain labs; refills provided.  Advised to start ASA 81mg  daily. - POCT urinalysis dipstick - Comprehensive metabolic panel - TSH  6. Screening for prostate cancer -DRE completed; obtain PSA. - POCT urinalysis dipstick - PSA  7. Dyspnea - New.Obtain EKG and CXR.  Refer to cardiology to rule out anginal equivalent;multiple cardiac risk factors including age, HTN, dyslipidemia, family history of CAD.       - Ambulatory referral to Cardiology - DG Chest 2 View; Future - EKG 12-Lead  8. Colon cancer screening -Agreeable to scheduling colonoscopy at next visit. - IFOBT POC (occult bld, rslt in office)   Norwood Levo, M.D. [facility] [place],  [place]  [address] [phone] phone [phone] fax

## 2014-08-23 ENCOUNTER — Encounter: Payer: Self-pay | Admitting: Cardiovascular Disease

## 2014-08-23 ENCOUNTER — Ambulatory Visit (INDEPENDENT_AMBULATORY_CARE_PROVIDER_SITE_OTHER): Payer: 59 | Admitting: Cardiovascular Disease

## 2014-08-23 VITALS — BP 122/92 | HR 63 | Ht 72.0 in | Wt 220.4 lb

## 2014-08-23 DIAGNOSIS — E785 Hyperlipidemia, unspecified: Secondary | ICD-10-CM

## 2014-08-23 DIAGNOSIS — I10 Essential (primary) hypertension: Secondary | ICD-10-CM

## 2014-08-23 DIAGNOSIS — R0609 Other forms of dyspnea: Secondary | ICD-10-CM | POA: Insufficient documentation

## 2014-08-23 NOTE — Progress Notes (Signed)
Primary care physician: Dr. Reginia Forts  HPI  This is a pleasant 54 year old male who was referred for evaluation for exertional dyspnea. He has no previous cardiac history. He has known history of hypertension, hyperlipidemia and family history of coronary artery disease. He chews tobacco but does not smoke cigarettes. I took care of his mother for many years. She died last year. She had extensive coronary artery disease and other medical problems. He reports cardiac cath in 2001 with no obstructive disease.  The patient reports progressive exertional dyspnea currently happening with minimal activities. Occasionally it is associated with substernal chest pain. No orthopnea, PND or lower extremity edema. He does not exercise on a regular basis and thinks that he might be out of shape.  Allergies  Allergen Reactions  . Penicillins Rash     Current Outpatient Prescriptions on File Prior to Visit  Medication Sig Dispense Refill  . fenofibrate 160 MG tablet Take 1 tablet (160 mg total) by mouth daily. 90 tablet 3  . hydrochlorothiazide (HYDRODIURIL) 12.5 MG tablet Take 1 tablet (12.5 mg total) by mouth daily. 90 tablet 3  . irbesartan (AVAPRO) 300 MG tablet TAKE 1 TABLET (300 MG TOTAL) BY MOUTH DAILY. 90 tablet 3  . levothyroxine (SYNTHROID, LEVOTHROID) 100 MCG tablet TAKE 1 TABLET (100 MCG TOTAL) BY MOUTH DAILY BEFORE BREAKFAST 90 tablet 0  . pantoprazole (PROTONIX) 40 MG tablet Take 1 tablet (40 mg total) by mouth daily. 90 tablet 3  . tadalafil (CIALIS) 20 MG tablet Take 1 tablet (20 mg total) by mouth daily as needed. 8 tablet 11   No current facility-administered medications on file prior to visit.     Past Medical History  Diagnosis Date  . Thyroid disease     per pt low thyroid  . Hyperlipidemia   . Hypertension   . Arthritis   . GERD (gastroesophageal reflux disease)   . Erectile dysfunction     Cialis PRN  . Hypogonadism male 08/23/2011    s/p urology consult [facility]; no treatment indicated due to potential of pregnancy.  . Glucose intolerance (impaired glucose tolerance)   . Renal insufficiency   . Multinodular goiter 07/23/2007    thyroid u/s: multinodular goiter; s/p ENT consult/[facility]:  Rx for Synthroid.  Marland Kitchen Unspecified hypothyroidism   . Tobacco use disorder   . Pain in joint, site unspecified   . Unspecified disorder of skin and subcutaneous tissue   . Mild sleep apnea      Past Surgical History  Procedure Laterality Date  . Cholecystectomy  2003  . Sleep study  07/23/2007  . Cardiac catheterization  07/23/1999    negative.  [place] Hals cyst  1981     Family History  Problem Relation Age of Onset  . Arthritis Mother   . Hypertension Mother   . COPD Mother   . Depression Mother   . Fibromyalgia Mother   . Gout Mother   . Kidney disease Mother   . Cancer Mother     lung  . Heart disease Mother 69    AMI s/p stenting  . Hypertension Father   . Benign prostatic hyperplasia Father   . Rosacea Father   . Arthritis Brother   . Benign prostatic hyperplasia Brother   . Heart disease Maternal Grandfather   . Hypertension Paternal Grandmother   . Stroke Paternal Grandfather   . Diabetes       History   Social History  . Marital Status: Single  Spouse Name: N/A    Number of Children: 0  . Years of Education: N/A   Occupational History  . 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 Smoking status: Never Smoker   . Smokeless tobacco: Current User     Comment: PATIENT CHEWS TOBACCO  20 years  . 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 Drug Use: No  . Sexual Activity: Yes   Other Topics Concern  . Not on file   Social History Narrative   Marital status: single; girlfriend passed in 02/2013 of breast cancer age 54.        Children: none      Lives: with father; mother passed away 2013/12/11.      Employment:  Works at [employer] in [place] x 27 years;  happy      Tobacco:  Chews tobacco x 25 years      Alcohol:  5 times per year at [employer]      Drugs:  None      Exercise: sporadic; bicycle stationary sporadically      Seatbelt:  50% of time      Guns: loaded secured guns in home.       Sexual activity: sexually active; no STDs; total sexual partners < 10.        Smoke alarm and carbon monoxide detector in the home.      Caffeine use: carbonated beverages, moderate amount.     ROS A 10 point review of system was performed. It is negative other than that mentioned in the history of present illness.   PHYSICAL EXAM   BP 122/92 mmHg  Pulse 63  Ht 6' (1.829 m)  Wt 220 lb 6.4 oz (99.973 kg)  BMI 29.89 kg/m2  SpO2 95% Constitutional: He is oriented to person, place, and time. He appears well-developed and well-nourished. No distress.  HENT: No nasal discharge.  Head: Normocephalic and atraumatic.  Eyes: Pupils are equal and round.  No discharge. Neck: Normal range of motion. Neck supple. No JVD present. No thyromegaly present.  Cardiovascular: Normal rate, regular rhythm, normal heart sounds. Exam reveals no gallop and no friction rub. No murmur heard.  Pulmonary/Chest: Effort normal and breath sounds normal. No stridor. No respiratory distress. He has no wheezes. He has no rales. He exhibits no tenderness.  Abdominal: Soft. Bowel sounds are normal. He exhibits no distension. There is no tenderness. There is no rebound and no guarding.  Musculoskeletal: Normal range of motion. He exhibits no edema and no tenderness.  Neurological: He is alert and oriented to person, place, and time. Coordination normal.  Skin: Skin is warm and dry. No rash noted. He is not diaphoretic. No erythema. No pallor.  Psychiatric: He has a normal mood and affect. His behavior is normal. Judgment and thought content normal.       EKG: Recent EKG was reviewed which showed normal sinus rhythm with no significant ST or T wave changes.   ASSESSMENT  AND PLAN

## 2014-08-23 NOTE — Assessment & Plan Note (Signed)
Lab Results  Component Value Date   CHOL 175 08/08/2014   HDL 34* 08/08/2014   LDLCALC 118* 08/08/2014   TRIG 117 08/08/2014   CHOLHDL 5.1 08/08/2014

## 2014-08-23 NOTE — Assessment & Plan Note (Signed)
Blood pressure is well controlled on current medications. 

## 2014-08-23 NOTE — Assessment & Plan Note (Signed)
This could be due to physical deconditioning. However, the patient has multiple risk factors for coronary artery disease. Thus, I recommend evaluation with a stress echocardiogram. If stress test is negative, I asked him to start an exercise program.

## 2014-08-23 NOTE — Patient Instructions (Signed)
Your physician has requested that you have a stress echocardiogram in [place]. For further information please visit [email or web address]. Please follow instruction sheet as given.  Your physician recommends that you schedule a follow-up appointment as needed with Dr Fletcher Anon.   Your physician recommends that you continue on your current medications as directed. Please refer to the Current Medication list given to you today.

## 2014-09-02 ENCOUNTER — Telehealth: Payer: Self-pay | Admitting: *Deleted

## 2014-09-02 NOTE — Telephone Encounter (Signed)
Reviewed stress echo instructions  Patient verbalized understanding

## 2014-09-05 ENCOUNTER — Other Ambulatory Visit: Payer: 59

## 2014-09-16 ENCOUNTER — Other Ambulatory Visit: Payer: 59

## 2014-09-27 ENCOUNTER — Other Ambulatory Visit (INDEPENDENT_AMBULATORY_CARE_PROVIDER_SITE_OTHER): Payer: 59

## 2014-09-27 ENCOUNTER — Encounter (INDEPENDENT_AMBULATORY_CARE_PROVIDER_SITE_OTHER): Payer: Self-pay

## 2014-09-27 DIAGNOSIS — I10 Essential (primary) hypertension: Secondary | ICD-10-CM

## 2014-09-27 DIAGNOSIS — E785 Hyperlipidemia, unspecified: Secondary | ICD-10-CM

## 2014-09-27 DIAGNOSIS — R0609 Other forms of dyspnea: Secondary | ICD-10-CM

## 2014-12-09 ENCOUNTER — Telehealth: Payer: Self-pay | Admitting: Family Medicine

## 2014-12-09 NOTE — Telephone Encounter (Signed)
Sent RF and notified pt.

## 2014-12-09 NOTE — Telephone Encounter (Signed)
Patient is calling to follow up on medication refill. Please call when ready! [phone]

## 2015-02-06 ENCOUNTER — Ambulatory Visit (INDEPENDENT_AMBULATORY_CARE_PROVIDER_SITE_OTHER): Payer: 59 | Admitting: Family Medicine

## 2015-02-06 ENCOUNTER — Encounter: Payer: Self-pay | Admitting: Family Medicine

## 2015-02-06 VITALS — BP 122/82 | HR 60 | Temp 97.9°F | Resp 16 | Ht 73.0 in | Wt 219.2 lb

## 2015-02-06 DIAGNOSIS — E785 Hyperlipidemia, unspecified: Secondary | ICD-10-CM

## 2015-02-06 DIAGNOSIS — K219 Gastro-esophageal reflux disease without esophagitis: Secondary | ICD-10-CM | POA: Diagnosis not present

## 2015-02-06 DIAGNOSIS — N521 Erectile dysfunction due to diseases classified elsewhere: Secondary | ICD-10-CM | POA: Diagnosis not present

## 2015-02-06 DIAGNOSIS — M79672 Pain in left foot: Secondary | ICD-10-CM

## 2015-02-06 DIAGNOSIS — Z1211 Encounter for screening for malignant neoplasm of colon: Secondary | ICD-10-CM | POA: Diagnosis not present

## 2015-02-06 DIAGNOSIS — M79671 Pain in right foot: Secondary | ICD-10-CM | POA: Diagnosis not present

## 2015-02-06 DIAGNOSIS — E034 Atrophy of thyroid (acquired): Secondary | ICD-10-CM

## 2015-02-06 DIAGNOSIS — R7302 Impaired glucose tolerance (oral): Secondary | ICD-10-CM | POA: Diagnosis not present

## 2015-02-06 DIAGNOSIS — E038 Other specified hypothyroidism: Secondary | ICD-10-CM | POA: Diagnosis not present

## 2015-02-06 DIAGNOSIS — N289 Disorder of kidney and ureter, unspecified: Secondary | ICD-10-CM

## 2015-02-06 LAB — LIPID PANEL
Cholesterol: 160 mg/dL (ref 0–200)
HDL: 28 mg/dL — AB (ref >=40)
LDL Cholesterol: 103 mg/dL — ABNORMAL HIGH (ref 0–99)
Total CHOL/HDL Ratio: 5.7 Ratio
Triglycerides: 143 mg/dL (ref <150)
VLDL: 29 mg/dL (ref 0–40)

## 2015-02-06 LAB — COMPREHENSIVE METABOLIC PANEL
ALT: 35 U/L (ref 0–53)
AST: 20 U/L (ref 0–37)
Albumin: 4.9 g/dL (ref 3.5–5.2)
Alkaline Phosphatase: 38 U/L — ABNORMAL LOW (ref 39–117)
BUN: 13 mg/dL (ref 6–23)
CO2: 22 mEq/L (ref 19–32)
Calcium: 9.7 mg/dL (ref 8.4–10.5)
Chloride: 104 mEq/L (ref 96–112)
Creat: 1.17 mg/dL (ref 0.50–1.35)
GLUCOSE: 111 mg/dL — AB (ref 70–99)
Potassium: 4.4 mEq/L (ref 3.5–5.3)
SODIUM: 139 meq/L (ref 135–145)
TOTAL PROTEIN: 7.4 g/dL (ref 6.0–8.3)
Total Bilirubin: 0.8 mg/dL (ref 0.2–1.2)

## 2015-02-06 LAB — HEMOGLOBIN A1C
Hgb A1c MFr Bld: 6 % — ABNORMAL HIGH (ref <5.7)
Mean Plasma Glucose: 126 mg/dL — ABNORMAL HIGH (ref <117)

## 2015-02-06 LAB — CBC WITH DIFFERENTIAL/PLATELET
BASOS ABS: 0.1 10*3/uL (ref 0.0–0.1)
Basophils Relative: 1 % (ref 0–1)
Eosinophils Absolute: 0.3 10*3/uL (ref 0.0–0.7)
Eosinophils Relative: 5 % (ref 0–5)
HEMATOCRIT: 43.9 % (ref 39.0–52.0)
Hemoglobin: 15.2 g/dL (ref 13.0–17.0)
LYMPHS ABS: 1.1 10*3/uL (ref 0.7–4.0)
Lymphocytes Relative: 19 % (ref 12–46)
MCH: 30.7 pg (ref 26.0–34.0)
MCHC: 34.6 g/dL (ref 30.0–36.0)
MCV: 88.7 fL (ref 78.0–100.0)
MONO ABS: 0.5 10*3/uL (ref 0.1–1.0)
MPV: 9.7 fL (ref 8.6–12.4)
Monocytes Relative: 8 % (ref 3–12)
Neutro Abs: 4 10*3/uL (ref 1.7–7.7)
Neutrophils Relative %: 67 % (ref 43–77)
PLATELETS: 272 10*3/uL (ref 150–400)
RBC: 4.95 MIL/uL (ref 4.22–5.81)
RDW: 13.5 % (ref 11.5–15.5)
WBC: 6 10*3/uL (ref 4.0–10.5)

## 2015-02-06 LAB — TSH: TSH: 3.955 u[IU]/mL (ref 0.350–4.500)

## 2015-02-06 LAB — T4, FREE: FREE T4: 1.02 ng/dL (ref 0.80–1.80)

## 2015-02-06 MED ORDER — DICLOFENAC SODIUM 1 % TD GEL: 2.0000 g | Freq: Four times a day (QID) | TRANSDERMAL | Status: DC

## 2015-02-06 NOTE — Progress Notes (Signed)
Subjective:    Patient ID: Francisco Gallagher, male    DOB: [birth year], 54 y.o.   MRN: [id number]  02/06/2015  Follow-up; Hypertension; glucose; Hyperlipidemia; and Medication Refill   HPI This 54 y.o. male presents for six month follow-up of the following:  1. HTN:  Patient reports good compliance with medication, good tolerance to medication, and good symptom control.  Home BPs running 120s/80s.  Taking HCTZ only on weekends;takes 3 days per week.  Forgetting ASA 81mg  daily.     2. Hyperlipidemia: Patient reports good compliance with medication, good tolerance to medication, and good symptom control.    3.  Glucose Intolerance:  Continues to drink regular sodas; not monitoring sugar intake; no exercise currently.    4.  Hypothyroidism:  Patient reports good compliance with medication, good tolerance to medication, and good symptom control.  Denies weight gain or loss, fatigue, skin or hair changes.  5. DOE:  Referred to cardiology/[facility] after last visit; s/p stress echo that was WNL.  Diagnosed with deconditioning; recommended exercise program.  Not exercising curretnly. Doing sit ups. . DOE is better; no worsening; no swelling.  6.  ED:  Using Cialis as needed.  Not dating currently. Cialis works well.  7. B feet pain: R>L; onset one month ago.  Pain located around posterior ankle and radiates into heel and into L first toe; by end of day, limping.  Then some days no pain.  Also having B knee pain; also L thumb pain.  Wearing steel toe shoe; has inserts in shoes at work. Feels work related.  No medications.  No ice.  Rarely walks barefooted.  No swelling.  Stiffness in morning and after prolonged sitting.  Stiffness all over from lower body on.  Previously evaluated by [facility] in 1990.  8. Colon cancer screening: now agreeable to colonoscopy.  Review of Systems  Constitutional: Negative for fever, chills, diaphoresis, activity change, appetite change and fatigue.  Respiratory: Negative  for cough and shortness of breath.   Cardiovascular: Negative for chest pain, palpitations and leg swelling.  Gastrointestinal: Negative for nausea, vomiting, abdominal pain and diarrhea.  Endocrine: Negative for cold intolerance, heat intolerance, polydipsia, polyphagia and polyuria.  Musculoskeletal: Positive for arthralgias and gait problem. Negative for joint swelling.  Skin: Negative for color change, rash and wound.  Neurological: Negative for dizziness, tremors, seizures, syncope, facial asymmetry, speech difficulty, weakness, light-headedness, numbness and headaches.  Psychiatric/Behavioral: Negative for sleep disturbance and dysphoric mood. The patient is not nervous/anxious.     Past Medical History  Diagnosis Date  . Thyroid disease     per pt low thyroid  . Hyperlipidemia   . Hypertension   . Arthritis   . GERD (gastroesophageal reflux disease)   . Erectile dysfunction     Cialis PRN  . Hypogonadism male 08/23/2011    s/p urology consult [facility]; no treatment indicated due to potential of pregnancy.  . Glucose intolerance (impaired glucose tolerance)   . Renal insufficiency   . Multinodular goiter 07/23/2007    thyroid u/s: multinodular goiter; s/p ENT consult/[facility]:  Rx for Synthroid.  Marland Kitchen Unspecified hypothyroidism   . Tobacco use disorder   . Pain in joint, site unspecified   . Unspecified disorder of skin and subcutaneous tissue   . Mild sleep apnea    Past Surgical History  Procedure Laterality Date  . Cholecystectomy  2003  . Sleep study  07/23/2007  . Cardiac catheterization  07/23/1999    negative.  [place] Hals  cyst  1981   Allergies  Allergen Reactions  . Penicillins Rash   History   Social History  . Marital Status: Single    Spouse Name: N/A  . Number of Children: 0  . Years of Education: N/A   Occupational History  . 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 Smoking status: Never Smoker   .  Smokeless tobacco: Current User     Comment: PATIENT CHEWS TOBACCO  20 years  . 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 Drug Use: No  . Sexual Activity: Yes   Other Topics Concern  . Not on file   Social History Narrative   Marital status: single; girlfriend passed in 02/2013 of breast cancer age 87.        Children: none      Lives: with father; mother passed away 24-Nov-2013.      Employment:  Works at [employer] in [place] x 27 years; happy      Tobacco:  Chews tobacco x 25 years      Alcohol:  5 times per year at [employer]      Drugs:  None      Exercise: sporadic; bicycle stationary sporadically      Seatbelt:  50% of time      Guns: loaded secured guns in home.       Sexual activity: sexually active; no STDs; total sexual partners < 10.        Smoke alarm and carbon monoxide detector in the home.      Caffeine use: carbonated beverages, moderate amount.   Family History  Problem Relation Age of Onset  . Arthritis Mother   . Hypertension Mother   . COPD Mother   . Depression Mother   . Fibromyalgia Mother   . Gout Mother   . Kidney disease Mother   . Cancer Mother     lung  . Heart disease Mother 52    AMI s/p stenting  . Hypertension Father   . Benign prostatic hyperplasia Father   . Rosacea Father   . Arthritis Brother   . Benign prostatic hyperplasia Brother   . Heart disease Maternal Grandfather   . Hypertension Paternal Grandmother   . Stroke Paternal Grandfather   . Diabetes          Objective:    BP 122/82 mmHg  Pulse 60  Temp(Src) 97.9 F (36.6 C) (Oral)  Resp 16  Ht 6\' 1"  (1.854 m)  Wt 219 lb 3.2 oz (99.428 kg)  BMI 28.93 kg/m2  SpO2 96% Physical Exam  Constitutional: He is oriented to person, place, and time. He appears well-developed and well-nourished. No distress.  HENT:  Head: Normocephalic and atraumatic.  Right Ear: External ear normal.  Left Ear: External ear normal.  Nose: Nose normal.    Mouth/Throat: Oropharynx is clear and moist.  Eyes: Conjunctivae and EOM are normal. Pupils are equal, round, and reactive to light.  Neck: Normal range of motion. Neck supple. Carotid bruit is not present. No thyromegaly present.  Cardiovascular: Normal rate, regular rhythm, normal heart sounds and intact distal pulses.  Exam reveals no gallop and no friction rub.   No murmur heard. Pulmonary/Chest: Effort normal and breath sounds normal. He has no wheezes. He has no rales.  Abdominal: Soft. Bowel sounds are normal. He exhibits no  distension and no mass. There is no tenderness. There is no rebound and no guarding.  Musculoskeletal:       Right ankle: Normal.       Left ankle: Normal.       Right foot: Normal.       Left foot: Normal.  Lymphadenopathy:    He has no cervical adenopathy.  Neurological: He is alert and oriented to person, place, and time. No cranial nerve deficit.  Skin: Skin is warm and dry. No rash noted. He is not diaphoretic.  Psychiatric: He has a normal mood and affect. His behavior is normal.  Nursing note and vitals reviewed.       Assessment & Plan:   1. Hypothyroidism due to acquired atrophy of thyroid   2. Hyperlipidemia   3. Glucose intolerance (impaired glucose tolerance)   4. Gastroesophageal reflux disease, esophagitis presence not specified   5. Erectile dysfunction due to diseases classified elsewhere   6. Colon cancer screening   7. Pain in both feet     1. Hypothyroidism: controlled; obtain labs; continue current medications. 2.  Hyperlipidemia: moderately controlled; obtain labs; continue current medications. 3.  Glucose Intolerance: stable; obtain labs; non-compliant with dietary modification and exercise. 4.  GERD: controlled; using PPI PRN. 5.  ED: stable; continue current medication. 6.  Colon cancer screening: refer for colonoscopy; asymptomatic. 7.  B feet pain R>L: New.  Onset one month ago; no injury; no swelling; rx for Voltaren gel  provided to use tid due to renal insufficiency; if no improvement in one month, call office and will refer for podiatry/[facility].  Recommend icing feet qhs for 20 minutes. 8. Renal insufficiency: stable; if worsens, obtain renal US and refer to nephrology.  Meds ordered this encounter  Medications  . diclofenac sodium (VOLTAREN) 1 % GEL    Sig: Apply 2 g topically 4 (four) times daily.    Dispense:  100 g    Refill:  5    Return in about 6 months (around 08/09/2015) for complete physical examiniation.    Kristi Elayne Guerin, M.D. [facility] [address] [place], [place]  [address] [phone] phone [phone] fax

## 2015-02-06 NOTE — Patient Instructions (Addendum)
1.  START ASPIRIN 81MG  ONE TABLET DAILY.  High Cholesterol High cholesterol refers to having a high level of cholesterol in your blood. Cholesterol is a white, waxy, fat-like protein that your body needs in small amounts. Your liver makes all the cholesterol you need. Excess cholesterol comes from the food you eat. Cholesterol travels in your bloodstream through your blood vessels. If you have high cholesterol, deposits (plaque) may build up on the walls of your blood vessels. This makes the arteries narrower and stiffer. Plaque increases your risk of heart attack and stroke. Work with your health care provider to keep your cholesterol levels in a healthy range. RISK FACTORS Several things can make you more likely to have high cholesterol. These include:   Eating foods high in animal fat (saturated fat) or cholesterol.  Being overweight.  Not getting enough exercise.  Having a family history of high cholesterol. SIGNS AND SYMPTOMS High cholesterol does not cause symptoms. DIAGNOSIS  Your health care provider can do a blood test to check whether you have high cholesterol. If you are older than 20, your health care provider may check your cholesterol every 4-6 years. You may be checked more often if you already have high cholesterol or other risk factors for heart disease. The blood test for cholesterol measures the following:  Bad cholesterol (LDL cholesterol). This is the type of cholesterol that causes heart disease. This number should be less than 100.  Good cholesterol (HDL cholesterol). This type helps protect against heart disease. A healthy level of HDL cholesterol is 60 or higher.  Total cholesterol. This is the combined number of LDL cholesterol and HDL cholesterol. A healthy number is less than 200. TREATMENT  High cholesterol can be treated with diet changes, lifestyle changes, and medicine.   Diet changes may include eating more whole grains, fruits, vegetables, nuts, and fish.  You may also have to cut back on red meat and foods with a lot of added sugar.  Lifestyle changes may include getting at least 40 minutes of aerobic exercise three times a week. Aerobic exercises include walking, biking, and swimming. Aerobic exercise along with a healthy diet can help you maintain a healthy weight. Lifestyle changes may also include quitting smoking.  If diet and lifestyle changes are not enough to lower your cholesterol, your health care provider may prescribe a statin medicine. This medicine has been shown to lower cholesterol and also lower the risk of heart disease. HOME CARE INSTRUCTIONS  Only take over-the-counter or prescription medicines as directed by your health care provider.   Follow a healthy diet as directed by your health care provider. For instance:   Eat chicken (without skin), fish, veal, shellfish, ground [place] breast, and round or loin cuts of red meat.  Do not eat fried foods and fatty meats, such as hot dogs and salami.   Eat plenty of fruits, such as apples.   Eat plenty of vegetables, such as broccoli, potatoes, and carrots.   Eat beans, peas, and lentils.   Eat grains, such as barley, rice, couscous, and bulgur wheat.   Eat pasta without cream sauces.   Use skim or nonfat milk and low-fat or nonfat yogurt and cheeses. Do not eat or drink whole milk, cream, ice cream, egg yolks, and hard cheeses.   Do not eat stick margarine or tub margarines that contain trans fats (also called partially hydrogenated oils).   Do not eat cakes, cookies, crackers, or other baked goods that contain trans fats.  Do not eat saturated tropical oils, such as coconut and palm oil.   Exercise as directed by your health care provider. Increase your activity level with activities such as gardening or walking.   Keep all follow-up appointments.  SEEK MEDICAL CARE IF:  You are struggling to maintain a healthy diet or weight.  You need help starting  an exercise program.  You need help to stop smoking. SEEK IMMEDIATE MEDICAL CARE IF:  You have chest pain.  You have trouble breathing. Document Released: 07/08/2005 Document Revised: 11/22/2013 Document Reviewed: 04/30/2013 [facility] Patient Information 2015 [place], [place]. This information is not intended to replace advice given to you by your health care provider. Make sure you discuss any questions you have with your health care provider.

## 2015-02-12 MED ORDER — LEVOTHYROXINE SODIUM 125 MCG PO TABS: 125.0000 ug | ORAL_TABLET | Freq: Every day | ORAL | Status: DC

## 2015-02-12 NOTE — Addendum Note (Signed)
Addended by: Wardell Honour on: 02/12/2015 12:27 PM   Modules accepted: Orders

## 2015-03-15 ENCOUNTER — Encounter: Payer: Self-pay | Admitting: Internal Medicine

## 2015-05-19 ENCOUNTER — Ambulatory Visit (AMBULATORY_SURGERY_CENTER): Payer: Self-pay

## 2015-05-19 VITALS — Ht 72.0 in | Wt 223.6 lb

## 2015-05-19 DIAGNOSIS — Z1211 Encounter for screening for malignant neoplasm of colon: Secondary | ICD-10-CM

## 2015-05-19 NOTE — Progress Notes (Signed)
No allergies to eggs or soy No diet/weight loss meds No past problems with anesthesia No home oxygen  No internet 

## 2015-05-30 ENCOUNTER — Encounter: Payer: Self-pay | Admitting: Internal Medicine

## 2015-06-02 ENCOUNTER — Encounter: Payer: Self-pay | Admitting: Internal Medicine

## 2015-06-02 ENCOUNTER — Ambulatory Visit (AMBULATORY_SURGERY_CENTER): Payer: 59 | Admitting: Internal Medicine

## 2015-06-02 VITALS — BP 133/65 | HR 41 | Temp 97.0°F | Resp 13 | Ht 72.0 in | Wt 233.0 lb

## 2015-06-02 DIAGNOSIS — Z1211 Encounter for screening for malignant neoplasm of colon: Secondary | ICD-10-CM | POA: Diagnosis not present

## 2015-06-02 DIAGNOSIS — D125 Benign neoplasm of sigmoid colon: Secondary | ICD-10-CM

## 2015-06-02 MED ORDER — SODIUM CHLORIDE 0.9 % IV SOLN: 500.0000 mL | INTRAVENOUS | Status: DC

## 2015-06-02 NOTE — Op Note (Signed)
[place]  [address] [place], [address]   COLONOSCOPY PROCEDURE REPORT  PATIENT: Francisco Gallagher, Francisco Gallagher  MR#: [id number] BIRTHDATE: 08-16-1960 , 9  yrs. old GENDER: male ENDOSCOPIST: Gatha Mayer, MD, [place] [facility] PROCEDURE DATE:  06/02/2015 PROCEDURE:   Colonoscopy, screening and Colonoscopy with snare polypectomy First Screening Colonoscopy - Avg.  risk and is 50 yrs.  old or older Yes.  Prior Negative Screening - Now for repeat screening. N/A  History of Adenoma - Now for follow-up colonoscopy & has been > or = to 3 yrs.  N/A  Polyps removed today? Yes ASA CLASS:   Class II INDICATIONS:Screening for colonic neoplasia and Colorectal Neoplasm Risk Assessment for this procedure is average risk. MEDICATIONS: Propofol 300 mg IV, Monitored anesthesia care, and Lidocaine 40 mg IV  DESCRIPTION OF PROCEDURE:   After the risks benefits and alternatives of the procedure were thoroughly explained, informed consent was obtained.  The digital rectal exam revealed no rectal mass and revealed an enlarged prostate.   The [id number] endoscope was introduced through the anus and advanced to the cecum, which was identified by both the appendix and ileocecal valve. No adverse events experienced.   The quality of the prep was excellent.  (MiraLax was used)  The instrument was then slowly withdrawn as the colon was fully examined. Estimated blood loss is zero unless otherwise noted in this procedure report.      COLON FINDINGS: A polypoid shaped sessile polyp measuring 7 mm in size was found in the sigmoid colon.  A polypectomy was performed with a cold snare.  The resection was complete, the polyp tissue was completely retrieved and sent to histology.   The examination was otherwise normal.  Retroflexed views revealed no abnormalities. The time to cecum = 3.4 Withdrawal time = 9.4   The scope was withdrawn and the procedure completed. COMPLICATIONS: There were no immediate  complications.  ENDOSCOPIC IMPRESSION: 1.   Sessile polyp was found in the sigmoid colon; polypectomy was performed with a cold snare 2.   The examination was otherwise normal  RECOMMENDATIONS: Timing of repeat colonoscopy will be determined by pathology findings.  eSigned:  Gatha Mayer, MD, [facility] 06/02/2015 11:35 AM   cc: The Patient and Dr. Reginia Forts

## 2015-06-02 NOTE — Progress Notes (Signed)
Stable to RR 

## 2015-06-02 NOTE — Patient Instructions (Addendum)
I found and removed one polyp - it looks benign.  I will let you know pathology results and when to have another routine colonoscopy by mail.  I appreciate the opportunity to care for you. Gatha Mayer, MD, FACG YOU HAD AN ENDOSCOPIC PROCEDURE TODAY AT [facility]:   Refer to the procedure report that was given to you for any specific questions about what was found during the examination.  If the procedure report does not answer your questions, please call your gastroenterologist to clarify.  If you requested that your care partner not be given the details of your procedure findings, then the procedure report has been included in a sealed envelope for you to review at your convenience later.  YOU SHOULD EXPECT: Some feelings of bloating in the abdomen. Passage of more gas than usual.  Walking can help get rid of the air that was put into your GI tract during the procedure and reduce the bloating. If you had a lower endoscopy (such as a colonoscopy or flexible sigmoidoscopy) you may notice spotting of blood in your stool or on the toilet paper. If you underwent a bowel prep for your procedure, you may not have a normal bowel movement for a few days.  Please Note:  You might notice some irritation and congestion in your nose or some drainage.  This is from the oxygen used during your procedure.  There is no need for concern and it should clear up in a day or so.  SYMPTOMS TO REPORT IMMEDIATELY:   Following lower endoscopy (colonoscopy or flexible sigmoidoscopy):  Excessive amounts of blood in the stool  Significant tenderness or worsening of abdominal pains  Swelling of the abdomen that is new, acute  Fever of 100F or higher   Black, tarry-looking stools  For urgent or emergent issues, a gastroenterologist can be reached at any hour by calling [phone].   DIET: Your first meal following the procedure should be a small meal and then it is ok to progress to  your normal diet. Heavy or fried foods are harder to digest and may make you feel nauseous or bloated.  Likewise, meals heavy in dairy and vegetables can increase bloating.  Drink plenty of fluids but you should avoid alcoholic beverages for 24 hours.  ACTIVITY:  You should plan to take it easy for the rest of today and you should NOT DRIVE or use heavy machinery until tomorrow (because of the sedation medicines used during the test).    FOLLOW UP: Our staff will call the number listed on your records the next business day following your procedure to check on you and address any questions or concerns that you may have regarding the information given to you following your procedure. If we do not reach you, we will leave a message.  However, if you are feeling well and you are not experiencing any problems, there is no need to return our call.  We will assume that you have returned to your regular daily activities without incident.  If any biopsies were taken you will be contacted by phone or by letter within the next 1-3 weeks.  Please call us at [phone] if you have not heard about the biopsies in 3 weeks.    SIGNATURES/CONFIDENTIALITY: You and/or your care partner have signed paperwork which will be entered into your electronic medical record.  These signatures attest to the fact that that the information above on your After Visit Summary has  been reviewed and is understood.  Full responsibility of the confidentiality of this discharge information lies with you and/or your care-partner.

## 2015-06-02 NOTE — Progress Notes (Signed)
Called to room to assist during endoscopic procedure.  Patient ID and intended procedure confirmed with present staff. Received instructions for my participation in the procedure from the performing physician.  

## 2015-06-05 ENCOUNTER — Telehealth: Payer: Self-pay | Admitting: Internal Medicine

## 2015-06-05 ENCOUNTER — Telehealth: Payer: Self-pay

## 2015-06-05 NOTE — Telephone Encounter (Signed)
Patient advised that may take a while to re-establish his normal bowel habits after a colonoscopy.  He is advised that he can take some MOM or Miralalx if he is uncomfortable. He will call back for any additional questions or concerns.

## 2015-06-05 NOTE — Telephone Encounter (Signed)
  Follow up Call-  Call back number 06/02/2015  Post procedure Call Back phone  # [phone]  Permission to leave phone message Yes     Patient questions:  Do you have a fever, pain , or abdominal swelling? No. Pain Score  0 *  Have you tolerated food without any problems? Yes.    Have you been able to return to your normal activities? Yes.    Do you have any questions about your discharge instructions: Diet   No. Medications  No. Follow up visit  No.  Do you have questions or concerns about your Care? No.  Actions: * If pain score is 4 or above: No action needed, pain <4.

## 2015-06-18 ENCOUNTER — Encounter: Payer: Self-pay | Admitting: Internal Medicine

## 2015-08-07 ENCOUNTER — Encounter: Payer: Self-pay | Admitting: Family Medicine

## 2015-08-07 ENCOUNTER — Ambulatory Visit (INDEPENDENT_AMBULATORY_CARE_PROVIDER_SITE_OTHER): Payer: 59 | Admitting: Family Medicine

## 2015-08-07 VITALS — BP 124/80 | HR 68 | Temp 97.8°F | Resp 18 | Wt 223.4 lb

## 2015-08-07 DIAGNOSIS — Z0000 Encounter for general adult medical examination without abnormal findings: Secondary | ICD-10-CM | POA: Diagnosis not present

## 2015-08-07 DIAGNOSIS — K429 Umbilical hernia without obstruction or gangrene: Secondary | ICD-10-CM

## 2015-08-07 DIAGNOSIS — N529 Male erectile dysfunction, unspecified: Secondary | ICD-10-CM

## 2015-08-07 DIAGNOSIS — E785 Hyperlipidemia, unspecified: Secondary | ICD-10-CM | POA: Diagnosis not present

## 2015-08-07 DIAGNOSIS — Z1159 Encounter for screening for other viral diseases: Secondary | ICD-10-CM

## 2015-08-07 DIAGNOSIS — K219 Gastro-esophageal reflux disease without esophagitis: Secondary | ICD-10-CM

## 2015-08-07 DIAGNOSIS — N182 Chronic kidney disease, stage 2 (mild): Secondary | ICD-10-CM

## 2015-08-07 DIAGNOSIS — Z113 Encounter for screening for infections with a predominantly sexual mode of transmission: Secondary | ICD-10-CM

## 2015-08-07 DIAGNOSIS — Z114 Encounter for screening for human immunodeficiency virus [HIV]: Secondary | ICD-10-CM

## 2015-08-07 DIAGNOSIS — R7302 Impaired glucose tolerance (oral): Secondary | ICD-10-CM | POA: Diagnosis not present

## 2015-08-07 DIAGNOSIS — I10 Essential (primary) hypertension: Secondary | ICD-10-CM

## 2015-08-07 DIAGNOSIS — E034 Atrophy of thyroid (acquired): Secondary | ICD-10-CM

## 2015-08-07 DIAGNOSIS — Z125 Encounter for screening for malignant neoplasm of prostate: Secondary | ICD-10-CM

## 2015-08-07 LAB — POCT URINALYSIS DIP (MANUAL ENTRY)
Bilirubin, UA: NEGATIVE
Glucose, UA: NEGATIVE
Ketones, POC UA: NEGATIVE
Leukocytes, UA: NEGATIVE
NITRITE UA: NEGATIVE
PH UA: 5.5
Protein Ur, POC: NEGATIVE
RBC UA: NEGATIVE
UROBILINOGEN UA: 0.2

## 2015-08-07 LAB — COMPREHENSIVE METABOLIC PANEL
ALK PHOS: 35 U/L — AB (ref 40–115)
ALT: 35 U/L (ref 9–46)
AST: 20 U/L (ref 10–35)
Albumin: 4.8 g/dL (ref 3.6–5.1)
BUN: 16 mg/dL (ref 7–25)
CALCIUM: 10.2 mg/dL (ref 8.6–10.3)
CHLORIDE: 105 mmol/L (ref 98–110)
CO2: 24 mmol/L (ref 20–31)
Creat: 1.45 mg/dL — ABNORMAL HIGH (ref 0.70–1.33)
GLUCOSE: 114 mg/dL — AB (ref 65–99)
POTASSIUM: 4.8 mmol/L (ref 3.5–5.3)
Sodium: 140 mmol/L (ref 135–146)
Total Bilirubin: 0.6 mg/dL (ref 0.2–1.2)
Total Protein: 7.2 g/dL (ref 6.1–8.1)

## 2015-08-07 LAB — CBC WITH DIFFERENTIAL/PLATELET
BASOS ABS: 0.1 10*3/uL (ref 0.0–0.1)
Basophils Relative: 1 % (ref 0–1)
EOS ABS: 0.3 10*3/uL (ref 0.0–0.7)
EOS PCT: 4 % (ref 0–5)
HEMATOCRIT: 44.7 % (ref 39.0–52.0)
Hemoglobin: 15.3 g/dL (ref 13.0–17.0)
LYMPHS ABS: 1.2 10*3/uL (ref 0.7–4.0)
Lymphocytes Relative: 18 % (ref 12–46)
MCH: 30.5 pg (ref 26.0–34.0)
MCHC: 34.2 g/dL (ref 30.0–36.0)
MCV: 89.2 fL (ref 78.0–100.0)
MONO ABS: 0.5 10*3/uL (ref 0.1–1.0)
MPV: 9.9 fL (ref 8.6–12.4)
Monocytes Relative: 7 % (ref 3–12)
Neutro Abs: 4.6 10*3/uL (ref 1.7–7.7)
Neutrophils Relative %: 70 % (ref 43–77)
PLATELETS: 314 10*3/uL (ref 150–400)
RBC: 5.01 MIL/uL (ref 4.22–5.81)
RDW: 13.6 % (ref 11.5–15.5)
WBC: 6.6 10*3/uL (ref 4.0–10.5)

## 2015-08-07 LAB — LIPID PANEL
Cholesterol: 159 mg/dL (ref 125–200)
HDL: 30 mg/dL — AB (ref >=40)
LDL CALC: 102 mg/dL (ref <130)
TRIGLYCERIDES: 134 mg/dL (ref <150)
Total CHOL/HDL Ratio: 5.3 Ratio — ABNORMAL HIGH (ref <=5.0)
VLDL: 27 mg/dL (ref <30)

## 2015-08-07 LAB — TSH: TSH: 1.784 u[IU]/mL (ref 0.350–4.500)

## 2015-08-07 LAB — HEPATITIS C ANTIBODY: HCV Ab: NEGATIVE

## 2015-08-07 LAB — HEMOGLOBIN A1C
Hgb A1c MFr Bld: 6.1 % — ABNORMAL HIGH (ref <5.7)
Mean Plasma Glucose: 128 mg/dL — ABNORMAL HIGH (ref <117)

## 2015-08-07 LAB — RPR

## 2015-08-07 LAB — HIV ANTIBODY (ROUTINE TESTING W REFLEX): HIV: NONREACTIVE

## 2015-08-07 MED ORDER — DICLOFENAC SODIUM 1 % TD GEL: 2.0000 g | Freq: Four times a day (QID) | TRANSDERMAL | Status: DC

## 2015-08-07 MED ORDER — TADALAFIL 20 MG PO TABS: 20.0000 mg | ORAL_TABLET | Freq: Every day | ORAL | Status: DC | PRN

## 2015-08-07 MED ORDER — PANTOPRAZOLE SODIUM 40 MG PO TBEC: 40.0000 mg | DELAYED_RELEASE_TABLET | Freq: Every day | ORAL | Status: DC

## 2015-08-07 MED ORDER — HYDROCHLOROTHIAZIDE 12.5 MG PO TABS: 12.5000 mg | ORAL_TABLET | Freq: Every day | ORAL | Status: DC

## 2015-08-07 MED ORDER — FENOFIBRATE 160 MG PO TABS: 160.0000 mg | ORAL_TABLET | Freq: Every day | ORAL | Status: DC

## 2015-08-07 MED ORDER — LEVOTHYROXINE SODIUM 125 MCG PO TABS: 125.0000 ug | ORAL_TABLET | Freq: Every day | ORAL | Status: DC

## 2015-08-07 MED ORDER — IRBESARTAN 300 MG PO TABS: ORAL_TABLET | ORAL | Status: DC

## 2015-08-07 NOTE — Patient Instructions (Signed)

## 2015-08-07 NOTE — Progress Notes (Signed)
Subjective:    Patient ID: Francisco Gallagher, male    DOB: March 18, 1961, 55 y.o.   MRN: [id number]  08/07/2015  Annual Exam   HPI This 55 y.o. male presents for Complete Physical Examination.  Last physical: 08-08-2014 Colonoscopy:  06-02-2015; one polyp; no cancer; Carlean Purl.   TDAP:  2009 Influenza:  REFUSES Eye exam:   +Glasses; no glaucoma or cataracts. Dental exam:  Every three months.    Review of Systems  Constitutional: Negative for fever, chills, diaphoresis, activity change, appetite change, fatigue and unexpected weight change.  HENT: Positive for tinnitus. Negative for congestion, dental problem, drooling, ear discharge, ear pain, facial swelling, hearing loss, mouth sores, nosebleeds, postnasal drip, rhinorrhea, sinus pressure, sneezing, sore throat, trouble swallowing and voice change.   Eyes: Negative for photophobia, pain, discharge, redness, itching and visual disturbance.  Respiratory: Negative for apnea, cough, choking, chest tightness, shortness of breath, wheezing and stridor.   Cardiovascular: Negative for chest pain, palpitations and leg swelling.  Gastrointestinal: Negative for nausea, vomiting, abdominal pain, diarrhea, constipation and blood in stool.  Endocrine: Negative for cold intolerance, heat intolerance, polydipsia, polyphagia and polyuria.  Genitourinary: Negative for dysuria, urgency, frequency, hematuria, flank pain, decreased urine volume, discharge, penile swelling, scrotal swelling, enuresis, difficulty urinating, genital sores, penile pain and testicular pain.  Musculoskeletal: Positive for arthralgias. Negative for myalgias, back pain, joint swelling, gait problem, neck pain and neck stiffness.  Skin: Negative for color change, pallor, rash and wound.  Allergic/Immunologic: Negative for environmental allergies, food allergies and immunocompromised state.  Neurological: Negative for dizziness, tremors, seizures, syncope, facial asymmetry, speech  difficulty, weakness, light-headedness, numbness and headaches.  Hematological: Negative for adenopathy. Does not bruise/bleed easily.  Psychiatric/Behavioral: Negative for suicidal ideas, hallucinations, behavioral problems, confusion, sleep disturbance, self-injury, dysphoric mood, decreased concentration and agitation. The patient is not nervous/anxious and is not hyperactive.     Past Medical History  Diagnosis Date  . Thyroid disease     per pt low thyroid  . Hyperlipidemia   . Hypertension   . Arthritis   . GERD (gastroesophageal reflux disease)   . Erectile dysfunction     Cialis PRN  . Hypogonadism male 08/23/2011    s/p urology consult [facility]; no treatment indicated due to potential of pregnancy.  . Glucose intolerance (impaired glucose tolerance)   . Renal insufficiency   . Multinodular goiter 07/23/2007    thyroid u/s: multinodular goiter; s/p ENT consult/[facility]:  Rx for Synthroid.  Marland Kitchen Unspecified hypothyroidism   . Tobacco use disorder   . Pain in joint, site unspecified   . Unspecified disorder of skin and subcutaneous tissue   . Mild sleep apnea    Past Surgical History  Procedure Laterality Date  . Cholecystectomy  2003  . Sleep study  07/23/2007  . Cardiac catheterization  07/23/1999    negative.  . Pilonydal cyst  1981  . Excision nosdule   l shouldher Left 2 2014   Allergies  Allergen Reactions  . Penicillins Rash   Current Outpatient Prescriptions  Medication Sig Dispense Refill  . aspirin 81 MG tablet Take 81 mg by mouth daily.    . diclofenac sodium (VOLTAREN) 1 % GEL Apply 2 g topically 4 (four) times daily. 100 g 5  . fenofibrate 160 MG tablet Take 1 tablet (160 mg total) by mouth daily. 90 tablet 3  . hydrochlorothiazide (HYDRODIURIL) 12.5 MG tablet Take 1 tablet (12.5 mg total) by mouth daily. 90 tablet 3  . irbesartan (AVAPRO) 300  MG tablet TAKE 1 TABLET (300 MG TOTAL) BY MOUTH DAILY. 90 tablet 3  . levothyroxine (SYNTHROID, LEVOTHROID) 125  MCG tablet Take 1 tablet (125 mcg total) by mouth daily before breakfast. 90 tablet 3  . pantoprazole (PROTONIX) 40 MG tablet Take 1 tablet (40 mg total) by mouth daily. 90 tablet 3  . tadalafil (CIALIS) 20 MG tablet Take 1 tablet (20 mg total) by mouth daily as needed. 8 tablet 11   No current facility-administered medications for this visit.   Social History   Social History  . Marital Status: Single    Spouse Name: N/A  . Number of Children: 0  . Years of Education: N/A   Occupational History  . 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 Smoking status: Never Smoker   . Smokeless tobacco: Current User    Types: Chew     Comment: PATIENT CHEWS TOBACCO  20 years  . 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 Drug Use: No  . Sexual Activity: Yes   Other Topics Concern  . Not on file   Social History Narrative   Marital status: single; girlfriend passed in 02/2013 of breast cancer age 63.  Not dating in October 29, 2015.      Children: none      Lives: with father; mother passed away 2013/10/28.      Employment:  Works at [employer] in [place] x 28 years; happy      Tobacco:  Chews tobacco x 26 years      Alcohol:  5 times per year at [employer]      Drugs:  None      Exercise: sporadic; bicycle stationary sporadically      Seatbelt:  50% of time      Guns: loaded secured guns in home.       Sexual activity: sexually active; no STDs; total sexual partners < 10.        Smoke alarm and carbon monoxide detector in the home.      Caffeine use: carbonated beverages, moderate amount.   Family History  Problem Relation Age of Onset  . Arthritis Mother   . Hypertension Mother   . COPD Mother   . Depression Mother   . Fibromyalgia Mother   . Gout Mother   . Kidney disease Mother   . Cancer Mother     lung  . Heart disease Mother 67    AMI s/p stenting  . Hypertension Father   . Benign prostatic hyperplasia Father   .  Rosacea Father   . Arthritis Father   . Arthritis Brother   . Benign prostatic hyperplasia Brother   . Heart disease Maternal Grandfather   . Hypertension Paternal Grandmother   . Stroke Paternal Grandfather   . Diabetes    . Colon cancer Neg Hx        Objective:    BP 124/80 mmHg  Pulse 68  Temp(Src) 97.8 F (36.6 C) (Oral)  Resp 18  Wt 223 lb 6.4 oz (101.334 kg)  SpO2 95% Physical Exam  Constitutional: He is oriented to person, place, and time. He appears well-developed and well-nourished. No distress.  HENT:  Head: Normocephalic and atraumatic.  Right Ear: External ear normal.  Left Ear: External ear normal.  Nose: Nose normal.  Mouth/Throat: Oropharynx is clear and moist.  Eyes:  Conjunctivae and EOM are normal. Pupils are equal, round, and reactive to light.  Neck: Normal range of motion. Neck supple. Carotid bruit is not present. No thyromegaly present.  Cardiovascular: Normal rate, regular rhythm, normal heart sounds and intact distal pulses.  Exam reveals no gallop and no friction rub.   No murmur heard. Pulmonary/Chest: Effort normal and breath sounds normal. He has no wheezes. He has no rales.  Abdominal: Soft. Bowel sounds are normal. He exhibits no distension and no mass. There is no tenderness. There is no rebound and no guarding. A hernia is present. Hernia confirmed negative in the right inguinal area and confirmed negative in the left inguinal area.    Moderate sized reducible non-tender umbilical hernia.  Genitourinary: Rectum normal and testes normal. Prostate is enlarged. Prostate is not tender. Circumcised.  R scrotal cystic lesion at posterior superior epididymis.  Musculoskeletal:       Right shoulder: Normal.       Left shoulder: Normal.       Cervical back: Normal.  Lymphadenopathy:    He has no cervical adenopathy.       Right: No inguinal adenopathy present.       Left: No inguinal adenopathy present.  Neurological: He is alert and oriented to  person, place, and time. He has normal reflexes. No cranial nerve deficit. He exhibits normal muscle tone. Coordination normal.  Skin: Skin is warm and dry. No rash noted. He is not diaphoretic.  Psychiatric: He has a normal mood and affect. His behavior is normal. Judgment and thought content normal.        Assessment & Plan:   1. Routine physical examination   2. Essential hypertension   3. Hyperlipidemia   4. Glucose intolerance (impaired glucose tolerance)   5. Hypothyroidism due to acquired atrophy of thyroid   6. Screening for prostate cancer   7. Need for hepatitis C screening test   8. Umbilical hernia without obstruction and without gangrene   9. Screening for HIV (human immunodeficiency virus)   10. Screening for STD (sexually transmitted disease)   11. Gastroesophageal reflux disease without esophagitis   12. Essential hypertension, benign   13. Dyslipidemia   14. Erectile dysfunction, unspecified erectile dysfunction type     1. Complete Physical Examination: anticipatory guidance --- exercise, weight loss, ASA 81mg  daily.  Pt refuses flu vaccine.  Colonoscopy UTD.   2.  Screening Hepatitis C: obtain labs; per CDC guidelines. 3.  Umbilical hernia: worsening; pt desires to defer referral to general surgery until next visit.   4.  Screening STD/HIV: obtain GC/Chlam/RPR/HIV.  Orders Placed This Encounter  Procedures  . CBC with Differential/Platelet  . Comprehensive metabolic panel    Order Specific Question:  Has the patient fasted?    Answer:  Yes  . Hemoglobin A1c  . TSH  . Lipid panel    Order Specific Question:  Has the patient fasted?    Answer:  Yes  . HIV antibody  . PSA  . RPR  . Hepatitis C antibody  . POCT urinalysis dipstick   Meds ordered this encounter  Medications  . aspirin 81 MG tablet    Sig: Take 81 mg by mouth daily.  . pantoprazole (PROTONIX) 40 MG tablet    Sig: Take 1 tablet (40 mg total) by mouth daily.    Dispense:  90 tablet     Refill:  3  . levothyroxine (SYNTHROID, LEVOTHROID) 125 MCG tablet    Sig: Take 1 tablet (  125 mcg total) by mouth daily before breakfast.    Dispense:  90 tablet    Refill:  3  . irbesartan (AVAPRO) 300 MG tablet    Sig: TAKE 1 TABLET (300 MG TOTAL) BY MOUTH DAILY.    Dispense:  90 tablet    Refill:  3  . hydrochlorothiazide (HYDRODIURIL) 12.5 MG tablet    Sig: Take 1 tablet (12.5 mg total) by mouth daily.    Dispense:  90 tablet    Refill:  3  . fenofibrate 160 MG tablet    Sig: Take 1 tablet (160 mg total) by mouth daily.    Dispense:  90 tablet    Refill:  3  . diclofenac sodium (VOLTAREN) 1 % GEL    Sig: Apply 2 g topically 4 (four) times daily.    Dispense:  100 g    Refill:  5  . tadalafil (CIALIS) 20 MG tablet    Sig: Take 1 tablet (20 mg total) by mouth daily as needed.    Dispense:  8 tablet    Refill:  11    Return in about 6 months (around 02/04/2016) for recheck blood pressure, high cholesterol, thyroid.    Surya Folden Elayne Guerin, M.D. [facility] [address] [place], [place]  [phone] phone [phone] fax

## 2015-08-08 LAB — PSA: PSA: 3.9 ng/mL (ref <=4.00)

## 2015-08-14 ENCOUNTER — Encounter: Payer: Self-pay | Admitting: Family Medicine

## 2015-08-15 ENCOUNTER — Telehealth: Payer: Self-pay

## 2015-08-15 NOTE — Telephone Encounter (Signed)
PA completed on covermymeds for Cialis. Pending.

## 2015-08-15 NOTE — Addendum Note (Signed)
Addended by: Wardell Honour on: 08/15/2015 02:27 PM   Modules accepted: Orders

## 2015-08-16 ENCOUNTER — Telehealth: Payer: Self-pay

## 2015-08-16 NOTE — Telephone Encounter (Signed)
PATIENT STATES HE HAD HIS COMPLETE PHYSICAL WITH DR. Tamala Julian ABOUT A WEEK AGO. HE WOULD LIKE TO GET HIS RESULTS PLEASE. BEST PHONE [phone] (CELL) HE SAID WE CAN LEAVE A MESSAGE ON HIS VOICE MAIL IF HE DOES NOT ANSWER.  PHARMACY CHOICE IF NEEDED IS [facility] IN [place], [place]

## 2015-08-18 ENCOUNTER — Telehealth: Payer: Self-pay

## 2015-08-18 NOTE — Telephone Encounter (Signed)
LMOV -- Patient is scheduled for a renal ultrasound on 08/22/15 at 10:15am with a 10:00am arrival time at the [place] at [facility]).  He is to drink 16oz of water 30 minutes prior to his appointment.  If he needs to reschedule, the scheduling department can be reached at [phone].

## 2015-08-18 NOTE — Telephone Encounter (Signed)
See labs 

## 2015-08-18 NOTE — Telephone Encounter (Signed)
The address for [facility] is [address], [place], [place] [address] if he calls regarding the address.

## 2015-08-21 NOTE — Telephone Encounter (Signed)
PA approved through 08/14/16, case # [id number]. Notified pharm.

## 2015-08-22 ENCOUNTER — Ambulatory Visit
Admission: RE | Admit: 2015-08-22 | Discharge: 2015-08-22 | Disposition: A | Discharge status: Home / Self Care | Payer: 59 | Source: Ambulatory Visit | Attending: Family Medicine | Admitting: Family Medicine

## 2015-08-22 DIAGNOSIS — N182 Chronic kidney disease, stage 2 (mild): Secondary | ICD-10-CM | POA: Insufficient documentation

## 2015-08-29 ENCOUNTER — Telehealth: Payer: Self-pay

## 2015-08-29 NOTE — Telephone Encounter (Signed)
The patient called to request the results of his renal ultrasound that he had done on 08/22/15 in [place].  Please advise, thank you.  CB#: [phone]

## 2015-08-30 ENCOUNTER — Other Ambulatory Visit: Payer: Self-pay | Admitting: Family Medicine

## 2015-08-30 DIAGNOSIS — N2889 Other specified disorders of kidney and ureter: Secondary | ICD-10-CM

## 2015-08-30 NOTE — Telephone Encounter (Signed)
Please call radiology; I need an MRI no contrast I am assuming of the kidney; do I order an MRI of abdomen?

## 2015-08-30 NOTE — Telephone Encounter (Signed)
Spoke to [employer]. She said to order a MRI of abdomen. Do you want this done stat?

## 2015-08-30 NOTE — Telephone Encounter (Signed)
Spoke to pt regarding results. Relayed the message you had in chart. Pt is okay with having MRI done. Did not see referral for MRI yet. Advised pt we will contact him once it is scheduled.

## 2015-08-31 NOTE — Telephone Encounter (Signed)
MRI of abdomen ordered. It does not need to be STAT.

## 2015-09-14 ENCOUNTER — Other Ambulatory Visit: Payer: Self-pay | Admitting: Family Medicine

## 2015-09-18 ENCOUNTER — Other Ambulatory Visit: Payer: Self-pay

## 2015-09-18 DIAGNOSIS — N2889 Other specified disorders of kidney and ureter: Secondary | ICD-10-CM

## 2015-09-29 ENCOUNTER — Ambulatory Visit
Admission: RE | Admit: 2015-09-29 | Discharge: 2015-09-29 | Disposition: A | Discharge status: Home / Self Care | Payer: 59 | Source: Ambulatory Visit | Attending: Family Medicine | Admitting: Family Medicine

## 2015-09-29 DIAGNOSIS — N2889 Other specified disorders of kidney and ureter: Secondary | ICD-10-CM

## 2015-09-29 MED ORDER — GADOBENATE DIMEGLUMINE 529 MG/ML IV SOLN
20.0000 mL | Freq: Once | INTRAVENOUS | Status: AC | PRN
  Administered 2015-09-29: 20 mL via INTRAVENOUS

## 2015-10-04 ENCOUNTER — Telehealth: Payer: Self-pay

## 2015-10-04 NOTE — Telephone Encounter (Signed)
Pt is looking for his mri results  Best number [phone]

## 2015-10-04 NOTE — Telephone Encounter (Signed)
Please review

## 2015-10-09 NOTE — Telephone Encounter (Signed)
Advised patient of negative MRI results.  No renal mass identified.

## 2015-12-27 ENCOUNTER — Other Ambulatory Visit: Payer: Self-pay | Admitting: Family Medicine

## 2016-01-08 ENCOUNTER — Other Ambulatory Visit: Payer: Self-pay | Admitting: Family Medicine

## 2016-02-06 ENCOUNTER — Ambulatory Visit (INDEPENDENT_AMBULATORY_CARE_PROVIDER_SITE_OTHER): Payer: 59 | Admitting: Family Medicine

## 2016-02-06 ENCOUNTER — Encounter: Payer: Self-pay | Admitting: General Surgery

## 2016-02-06 ENCOUNTER — Encounter: Payer: Self-pay | Admitting: Family Medicine

## 2016-02-06 VITALS — BP 124/80 | HR 60 | Temp 98.9°F | Resp 18 | Ht 72.0 in | Wt 219.0 lb

## 2016-02-06 DIAGNOSIS — E034 Atrophy of thyroid (acquired): Secondary | ICD-10-CM

## 2016-02-06 DIAGNOSIS — K429 Umbilical hernia without obstruction or gangrene: Secondary | ICD-10-CM | POA: Diagnosis not present

## 2016-02-06 DIAGNOSIS — E291 Testicular hypofunction: Secondary | ICD-10-CM | POA: Diagnosis not present

## 2016-02-06 DIAGNOSIS — N289 Disorder of kidney and ureter, unspecified: Secondary | ICD-10-CM | POA: Diagnosis not present

## 2016-02-06 DIAGNOSIS — E038 Other specified hypothyroidism: Secondary | ICD-10-CM

## 2016-02-06 DIAGNOSIS — I10 Essential (primary) hypertension: Secondary | ICD-10-CM

## 2016-02-06 DIAGNOSIS — N400 Enlarged prostate without lower urinary tract symptoms: Secondary | ICD-10-CM

## 2016-02-06 DIAGNOSIS — S86811A Strain of other muscle(s) and tendon(s) at lower leg level, right leg, initial encounter: Secondary | ICD-10-CM

## 2016-02-06 DIAGNOSIS — K219 Gastro-esophageal reflux disease without esophagitis: Secondary | ICD-10-CM

## 2016-02-06 DIAGNOSIS — E785 Hyperlipidemia, unspecified: Secondary | ICD-10-CM | POA: Diagnosis not present

## 2016-02-06 DIAGNOSIS — R7302 Impaired glucose tolerance (oral): Secondary | ICD-10-CM | POA: Diagnosis not present

## 2016-02-06 DIAGNOSIS — S86911A Strain of unspecified muscle(s) and tendon(s) at lower leg level, right leg, initial encounter: Secondary | ICD-10-CM

## 2016-02-06 LAB — LIPID PANEL
CHOL/HDL RATIO: 6.4 ratio — AB (ref <=5.0)
CHOLESTEROL: 147 mg/dL (ref 125–200)
HDL: 23 mg/dL — AB (ref >=40)
LDL Cholesterol: 91 mg/dL (ref <130)
Triglycerides: 164 mg/dL — ABNORMAL HIGH (ref <150)
VLDL: 33 mg/dL — ABNORMAL HIGH (ref <30)

## 2016-02-06 LAB — CBC WITH DIFFERENTIAL/PLATELET
BASOS ABS: 70 {cells}/uL (ref 0–200)
BASOS PCT: 1 %
EOS PCT: 5 %
Eosinophils Absolute: 350 cells/uL (ref 15–500)
HEMATOCRIT: 44 % (ref 38.5–50.0)
Hemoglobin: 15 g/dL (ref 13.2–17.1)
Lymphocytes Relative: 23 %
Lymphs Abs: 1610 cells/uL (ref 850–3900)
MCH: 30.4 pg (ref 27.0–33.0)
MCHC: 34.1 g/dL (ref 32.0–36.0)
MCV: 89.2 fL (ref 80.0–100.0)
MONOS PCT: 7 %
MPV: 9.3 fL (ref 7.5–12.5)
Monocytes Absolute: 490 cells/uL (ref 200–950)
NEUTROS ABS: 4480 {cells}/uL (ref 1500–7800)
Neutrophils Relative %: 64 %
Platelets: 301 10*3/uL (ref 140–400)
RBC: 4.93 MIL/uL (ref 4.20–5.80)
RDW: 13.2 % (ref 11.0–15.0)
WBC: 7 10*3/uL (ref 3.8–10.8)

## 2016-02-06 LAB — COMPREHENSIVE METABOLIC PANEL
ALBUMIN: 4.4 g/dL (ref 3.6–5.1)
ALT: 27 U/L (ref 9–46)
AST: 19 U/L (ref 10–35)
Alkaline Phosphatase: 35 U/L — ABNORMAL LOW (ref 40–115)
BUN: 21 mg/dL (ref 7–25)
CALCIUM: 9.3 mg/dL (ref 8.6–10.3)
CHLORIDE: 107 mmol/L (ref 98–110)
CO2: 21 mmol/L (ref 20–31)
Creat: 1.35 mg/dL — ABNORMAL HIGH (ref 0.70–1.33)
Glucose, Bld: 113 mg/dL — ABNORMAL HIGH (ref 65–99)
Potassium: 4.7 mmol/L (ref 3.5–5.3)
Sodium: 139 mmol/L (ref 135–146)
TOTAL PROTEIN: 6.9 g/dL (ref 6.1–8.1)
Total Bilirubin: 0.6 mg/dL (ref 0.2–1.2)

## 2016-02-06 NOTE — Progress Notes (Signed)
Subjective:    Patient ID: Francisco Gallagher, male    DOB: 06-Apr-1961, 55 y.o.   MRN: [id number]  02/06/2016  Follow-up (6 MONTH) and Medication Refill (FENOFIBRATE,SYNTHROID,HYDRODIURIL)   HPI This 55 y.o. male presents for six month follow-up:   1. HTN: Patient reports good compliance with medication, good tolerance to medication, and good symptom control.  Home BP running and low; 110/70.    2. Hypercholesterolemia: Patient reports good compliance with medication, good tolerance to medication, and good symptom control.    3.  Glucose intolerance:   4.  R knee twisting: stiffens up on patient; uses topical Diclofenac gel PRN.  Gets stiff every afternoon.  S/p walk in clinic evaluation in 07/2015.  +OA.  5.  Hypothyrodism: Patient reports good compliance with medication, good tolerance to medication, and good symptom control.    6.  Renal insufficiency: s/p renal US.  Avoiding NSAIDs.    7. Umbilical hernia:  Comes and goes in protrusion. No pain.  8. Elevated PSA:  Repeat today.  Increase at visit six months ago.      Review of Systems  Constitutional: Negative for fever, chills, diaphoresis, activity change, appetite change and fatigue.  Respiratory: Negative for cough and shortness of breath.   Cardiovascular: Negative for chest pain, palpitations and leg swelling.  Gastrointestinal: Negative for nausea, vomiting, abdominal pain and diarrhea.  Endocrine: Negative for cold intolerance, heat intolerance, polydipsia, polyphagia and polyuria.  Musculoskeletal: Positive for arthralgias and joint swelling.  Skin: Negative for color change, rash and wound.  Neurological: Negative for dizziness, tremors, seizures, syncope, facial asymmetry, speech difficulty, weakness, light-headedness, numbness and headaches.  Psychiatric/Behavioral: Negative for sleep disturbance and dysphoric mood. The patient is not nervous/anxious.     Past Medical History:  Diagnosis Date  . Arthritis    knees  . Erectile dysfunction    Cialis PRN  . GERD (gastroesophageal reflux disease)   . Glucose intolerance (impaired glucose tolerance)   . Hyperlipidemia   . Hypertension   . Hypogonadism male 08/23/2011   s/p urology consult [facility]; no treatment indicated due to potential of pregnancy.  . Mild sleep apnea 2009   NEVER WAS GIVEN CPAP PER PT  . Multinodular goiter 07/23/2007   thyroid u/s: multinodular goiter; s/p ENT consult/[facility]:  Rx for Synthroid.  . Pain in joint, site unspecified   . Renal insufficiency    CYST ON KIDNEYS  . Thyroid disease    per pt low thyroid  . Tobacco use disorder   . Unspecified disorder of skin and subcutaneous tissue   . Unspecified hypothyroidism    Past Surgical History:  Procedure Laterality Date  . CARDIAC CATHETERIZATION  07/23/1999   negative.  . CHOLECYSTECTOMY  2003  . excision nosdule   L shouldher Left 2 2014  . INSERTION OF MESH N/A 02/27/2016   Procedure: INSERTION OF MESH;  Surgeon: Christene Lye, MD;  Location: [facility] ORS;  Service: General;  Laterality: N/A;  . pilonydal cyst  1981  . Sleep Study  07/23/2007  . VENTRAL HERNIA REPAIR N/A 02/27/2016   Procedure: HERNIA REPAIR VENTRAL ADULT;  Surgeon: Christene Lye, MD;  Location: [facility] OR[facility];  Service: General;  Laterality: N/A;   Allergies  Allergen Reactions  . Penicillins Rash    Has patient had a PCN reaction causing immediate rash, facial/tongue/throat swelling, SOB or lightheadedness with hypotension: rash Has patient had a PCN reaction causing severe rash involving mucus membranes or skin necrosis: no Has patient  had a PCN reaction that required hospitalization no Has patient had a PCN reaction occurring within the last 10 years:no If all of the above answers are "NO", then may proceed with Cephalosporin use.   Current Outpatient Prescriptions  Medication Sig Dispense Refill  . aspirin 81 MG tablet Take 81 mg by mouth as needed.     . diclofenac sodium  (VOLTAREN) 1 % GEL Apply 2 g topically 4 (four) times daily. 100 g 5  . fenofibrate 160 MG tablet Take 1 tablet (160 mg total) by mouth daily. (Patient taking differently: Take 160 mg by mouth every evening. ) 90 tablet 3  . hydrochlorothiazide (HYDRODIURIL) 12.5 MG tablet Take 1 tablet (12.5 mg total) by mouth daily. 90 tablet 3  . irbesartan (AVAPRO) 300 MG tablet TAKE 1 TABLET (300 MG TOTAL) BY MOUTH DAILY. (Patient taking differently: Take 300 mg by mouth every morning. TAKE 1 TABLET (300 MG TOTAL) BY MOUTH DAILY.) 90 tablet 3  . levothyroxine (SYNTHROID, LEVOTHROID) 125 MCG tablet Take 1 tablet (125 mcg total) by mouth daily before breakfast. 90 tablet 3  . pantoprazole (PROTONIX) 40 MG tablet Take 1 tablet (40 mg total) by mouth daily. (Patient taking differently: Take 40 mg by mouth every other day. ) 90 tablet 3  . tadalafil (CIALIS) 20 MG tablet Take 1 tablet (20 mg total) by mouth daily as needed. 8 tablet 11  . Multiple Vitamin (MULTIVITAMIN WITH MINERALS) TABS tablet Take 1 tablet by mouth daily.    Marland Kitchen oxyCODONE-acetaminophen (ROXICET) 5-325 MG tablet Take 1 tablet by mouth every 4 (four) hours as needed. 30 tablet 0   No current facility-administered medications for this visit.    Social History   Social History  . Marital status: Single    Spouse name: N/A  . Number of children: 0  . Years of education: N/A   Occupational History  . 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 Smoking status: Never Smoker  . Smokeless tobacco: Current User    Types: Chew     Comment: PATIENT CHEWS TOBACCO  20 years  . Alcohol use Yes     Comment:  5 TIMES/YEAR - [employer]  . Drug use: No  . Sexual activity: Yes   Other Topics Concern  . Not on file   Social History Narrative   Marital status: single; girlfriend passed in 02/2013 of breast cancer age 21.  Not dating in 2017.      Children: none      Lives: with father; mother passed away  11/16/13.      Employment:  Works at [employer] in [place] x 28 years; happy      Tobacco:  Chews tobacco x 26 years      Alcohol:  5 times per year at [employer]      Drugs:  None      Exercise: sporadic; bicycle stationary sporadically      Seatbelt:  50% of time      Guns: loaded secured guns in home.       Sexual activity: sexually active; no STDs; total sexual partners < 10.        Smoke alarm and carbon monoxide detector in the home.      Caffeine use: carbonated beverages, moderate amount.   Family History  Problem Relation Age of Onset  . Arthritis Mother   . Hypertension Mother   .  COPD Mother   . Depression Mother   . Fibromyalgia Mother   . Gout Mother   . Kidney disease Mother   . Cancer Mother     lung  . Heart disease Mother 23    AMI s/p stenting  . Hypertension Father   . Benign prostatic hyperplasia Father   . Rosacea Father   . Arthritis Father   . Arthritis Brother   . Benign prostatic hyperplasia Brother   . Heart disease Maternal Grandfather   . Hypertension Paternal Grandmother   . Stroke Paternal Grandfather   . Diabetes    . Colon cancer Neg Hx        Objective:    BP 124/80   Pulse 60   Temp 98.9 F (37.2 C) (Oral)   Resp 18   Ht 6' (1.829 m)   Wt 219 lb (99.3 kg)   SpO2 96%   BMI 29.70 kg/m   Physical Exam  Constitutional: He is oriented to person, place, and time. He appears well-developed and well-nourished. No distress.  HENT:  Head: Normocephalic and atraumatic.  Right Ear: External ear normal.  Left Ear: External ear normal.  Nose: Nose normal.  Mouth/Throat: Oropharynx is clear and moist.  Eyes: Conjunctivae and EOM are normal. Pupils are equal, round, and reactive to light.  Neck: Normal range of motion. Neck supple. Carotid bruit is not present. No thyromegaly present.  Cardiovascular: Normal rate, regular rhythm, normal heart sounds and intact distal pulses.  Exam reveals no gallop and no friction rub.   No murmur  heard. Pulmonary/Chest: Effort normal and breath sounds normal. He has no wheezes. He has no rales.  Abdominal: Soft. Bowel sounds are normal. He exhibits no distension and no mass. There is no tenderness. There is no rebound and no guarding. A hernia is present.    Umbilical hernia present.  Musculoskeletal:       Right knee: Normal.       Left knee: Normal.  Lymphadenopathy:    He has no cervical adenopathy.  Neurological: He is alert and oriented to person, place, and time. No cranial nerve deficit.  Skin: Skin is warm and dry. No rash noted. He is not diaphoretic.  Psychiatric: He has a normal mood and affect. His behavior is normal.  Nursing note and vitals reviewed.  Results for orders placed or performed in visit on 02/06/16  CBC with Differential/Platelet  Result Value Ref Range   WBC 7.0 3.8 - 10.8 K/uL   RBC 4.93 4.20 - 5.80 MIL/uL   Hemoglobin 15.0 13.2 - 17.1 g/dL   HCT 44.0 38.5 - 50.0 %   MCV 89.2 80.0 - 100.0 fL   MCH 30.4 27.0 - 33.0 pg   MCHC 34.1 32.0 - 36.0 g/dL   RDW 13.2 11.0 - 15.0 %   Platelets 301 140 - 400 K/uL   MPV 9.3 7.5 - 12.5 fL   Neutro Abs 4,480 1,500 - 7,800 cells/uL   Lymphs Abs 1,610 850 - 3,900 cells/uL   Monocytes Absolute 490 200 - 950 cells/uL   Eosinophils Absolute 350 15 - 500 cells/uL   Basophils Absolute 70 0 - 200 cells/uL   Neutrophils Relative % 64 %   Lymphocytes Relative 23 %   Monocytes Relative 7 %   Eosinophils Relative 5 %   Basophils Relative 1 %   Smear Review Criteria for review not met   Comprehensive metabolic panel  Result Value Ref Range   Sodium 139 135 -  146 mmol/L   Potassium 4.7 3.5 - 5.3 mmol/L   Chloride 107 98 - 110 mmol/L   CO2 21 20 - 31 mmol/L   Glucose, Bld 113 (H) 65 - 99 mg/dL   BUN 21 7 - 25 mg/dL   Creat 1.35 (H) 0.70 - 1.33 mg/dL   Total Bilirubin 0.6 0.2 - 1.2 mg/dL   Alkaline Phosphatase 35 (L) 40 - 115 U/L   AST 19 10 - 35 U/L   ALT 27 9 - 46 U/L   Total Protein 6.9 6.1 - 8.1 g/dL    Albumin 4.4 3.6 - 5.1 g/dL   Calcium 9.3 8.6 - 10.3 mg/dL  Hemoglobin A1c  Result Value Ref Range   Hgb A1c MFr Bld 6.0 (H) <5.7 %   Mean Plasma Glucose 126 mg/dL  Lipid panel  Result Value Ref Range   Cholesterol 147 125 - 200 mg/dL   Triglycerides 164 (H) <150 mg/dL   HDL 23 (L) >=40 mg/dL   Total CHOL/HDL Ratio 6.4 (H) <=5.0 Ratio   VLDL 33 (H) <30 mg/dL   LDL Cholesterol 91 <130 mg/dL  PSA  Result Value Ref Range   PSA 2.92 <=4.00 ng/mL       Assessment & Plan:   1. Hypothyroidism due to acquired atrophy of thyroid   2. Hypogonadism male   3. Gastroesophageal reflux disease without esophagitis   4. Glucose intolerance (impaired glucose tolerance)   5. Hyperlipidemia   6. Essential hypertension, benign   7. Renal insufficiency   8. Umbilical hernia without obstruction and without gangrene   9. BPH (benign prostatic hyperplasia)   10. Knee strain, right, initial encounter    -stable. -continue current medications. -refer to general surgery for umbilical hernia repair. -R knee strain: home exercise program provided; pt declined xray today; recommend topical Diclofenac gel qid.  If no improvement, recommend ortho consultation.   Orders Placed This Encounter  Procedures  . CBC with Differential/Platelet  . Comprehensive metabolic panel    Order Specific Question:   Has the patient fasted?    Answer:   Yes  . Hemoglobin A1c  . Lipid panel    Order Specific Question:   Has the patient fasted?    Answer:   Yes  . PSA  . Ambulatory referral to General Surgery    Referral Priority:   Routine    Referral Type:   Surgical    Referral Reason:   Specialty Services Required    Referred to Provider:   Christene Lye, MD    Requested Specialty:   General Surgery    Number of Visits Requested:   1   No orders of the defined types were placed in this encounter.   Return in about 6 months (around 08/08/2016) for complete physical examiniation.    Prem Coykendall Elayne Guerin, M.D. [facility] [address] [place], [place]  [address] [phone] phone [phone] fax

## 2016-02-06 NOTE — Patient Instructions (Addendum)
IF you received an x-ray today, you will receive an invoice from [facility]. Please contact [facility] at [phone] with questions or concerns regarding your invoice.   IF you received labwork today, you will receive an invoice from [employer]. Please contact [facility] at [phone] with questions or concerns regarding your invoice.   Our billing staff will not be able to assist you with questions regarding bills from these companies.  You will be contacted with the lab results as soon as they are available. The fastest way to get your results is to activate your My Chart account. Instructions are located on the last [place] of this paperwork. If you have not heard from [place] regarding the results in 2 weeks, please contact this office.    Generic Knee Exercises EXERCISES RANGE OF MOTION (ROM) AND STRETCHING EXERCISES These exercises may help you when beginning to rehabilitate your injury. Your symptoms may resolve with or without further involvement from your physician, physical therapist, or athletic trainer. While completing these exercises, remember:   Restoring tissue flexibility helps normal motion to return to the joints. This allows healthier, less painful movement and activity.  An effective stretch should be held for at least 30 seconds.  A stretch should never be painful. You should only feel a gentle lengthening or release in the stretched tissue. STRETCH - Knee Extension, Prone  Lie on your stomach on a firm surface, such as a bed or countertop. Place your right / left knee and leg just beyond the edge of the surface. You may wish to place a towel under the far end of your right / left thigh for comfort.  Relax your leg muscles and allow gravity to straighten your knee. Your clinician may advise you to add an ankle weight if more resistance is helpful for you.  You should feel a stretch in the back of your right / left knee.  Hold this position for __________ seconds. Repeat __________ times. Complete this stretch __________ times per day. * Your physician, physical therapist, or athletic trainer may ask you to add ankle weight to enhance your stretch.  RANGE OF MOTION - Knee Flexion, Active  Lie on your back with both knees straight. (If this causes back discomfort, bend your opposite knee, placing your foot flat on the floor.)  Slowly slide your heel back toward your buttocks until you feel a gentle stretch in the front of your knee or thigh.  Hold for __________ seconds. Slowly slide your heel back to the starting position. Repeat __________ times. Complete this exercise __________ times per day.  STRETCH - Quadriceps, Prone   Lie on your stomach on a firm surface, such as a bed or padded floor.  Bend your right / left knee and grasp your ankle. If you are unable to reach your ankle or pant leg, use a belt around your foot to lengthen your reach.  Gently pull your heel toward your buttocks. Your knee should not slide out to the side. You should feel a stretch in the front of your thigh and/or knee.  Hold this position for __________ seconds. Repeat __________ times. Complete this stretch __________ times per day.  STRETCH - Hamstrings, Supine   Lie on your back. Loop a belt or towel over the ball of your right / left foot.  Straighten your right / left knee and slowly pull on the belt to raise your leg. Do not allow the right / left knee to  bend. Keep your opposite leg flat on the floor.  Raise the leg until you feel a gentle stretch behind your right / left knee or thigh. Hold this position for __________ seconds. Repeat __________ times. Complete this stretch __________ times per day.  STRENGTHENING EXERCISES These exercises may help you when beginning to rehabilitate your injury. They may resolve your symptoms with or without further involvement from your physician, physical therapist, or athletic  trainer. While completing these exercises, remember:   Muscles can gain both the endurance and the strength needed for everyday activities through controlled exercises.  Complete these exercises as instructed by your physician, physical therapist, or athletic trainer. Progress the resistance and repetitions only as guided.  You may experience muscle soreness or fatigue, but the pain or discomfort you are trying to eliminate should never worsen during these exercises. If this pain does worsen, stop and make certain you are following the directions exactly. If the pain is still present after adjustments, discontinue the exercise until you can discuss the trouble with your clinician. STRENGTH - Quadriceps, Isometrics  Lie on your back with your right / left leg extended and your opposite knee bent.  Gradually tense the muscles in the front of your right / left thigh. You should see either your knee cap slide up toward your hip or increased dimpling just above the knee. This motion will push the back of the knee down toward the floor/mat/bed on which you are lying.  Hold the muscle as tight as you can without increasing your pain for __________ seconds.  Relax the muscles slowly and completely in between each repetition. Repeat __________ times. Complete this exercise __________ times per day.  STRENGTH - Quadriceps, Short Arcs   Lie on your back. Place a __________ inch towel roll under your knee so that the knee slightly bends.  Raise only your lower leg by tightening the muscles in the front of your thigh. Do not allow your thigh to rise.  Hold this position for __________ seconds. Repeat __________ times. Complete this exercise __________ times per day.  OPTIONAL ANKLE WEIGHTS: Begin with ____________________, but DO NOT exceed ____________________. Increase in 1 pound/0.5 kilogram increments.  STRENGTH - Quadriceps, Straight Leg Raises  Quality counts! Watch for signs that the quadriceps  muscle is working to insure you are strengthening the correct muscles and not "cheating" by substituting with healthier muscles.  Lay on your back with your right / left leg extended and your opposite knee bent.  Tense the muscles in the front of your right / left thigh. You should see either your knee cap slide up or increased dimpling just above the knee. Your thigh may even quiver.  Tighten these muscles even more and raise your leg 4 to 6 inches off the floor. Hold for __________ seconds.  Keeping these muscles tense, lower your leg.  Relax the muscles slowly and completely in between each repetition. Repeat __________ times. Complete this exercise __________ times per day.  STRENGTH - Hamstring, Curls  Lay on your stomach with your legs extended. (If you lay on a bed, your feet may hang over the edge.)  Tighten the muscles in the back of your thigh to bend your right / left knee up to 90 degrees. Keep your hips flat on the bed/floor.  Hold this position for __________ seconds.  Slowly lower your leg back to the starting position. Repeat __________ times. Complete this exercise __________ times per day.  OPTIONAL ANKLE WEIGHTS: Begin with ____________________,  but DO NOT exceed ____________________. Increase in 1 pound/0.5 kilogram increments.  STRENGTH - Quadriceps, Squats  Stand in a door frame so that your feet and knees are in line with the frame.  Use your hands for balance, not support, on the frame.  Slowly lower your weight, bending at the hips and knees. Keep your lower legs upright so that they are parallel with the door frame. Squat only within the range that does not increase your knee pain. Never let your hips drop below your knees.  Slowly return upright, pushing with your legs, not pulling with your hands. Repeat __________ times. Complete this exercise __________ times per day.  STRENGTH - Quadriceps, Wall Slides  Follow guidelines for form closely. Increased  knee pain often results from poorly placed feet or knees.  Lean against a smooth wall or door and walk your feet out 18-24 inches. Place your feet hip-width apart.  Slowly slide down the wall or door until your knees bend __________ degrees.* Keep your knees over your heels, not your toes, and in line with your hips, not falling to either side.  Hold for __________ seconds. Stand up to rest for __________ seconds in between each repetition. Repeat __________ times. Complete this exercise __________ times per day. * Your physician, physical therapist, or athletic trainer will alter this angle based on your symptoms and progress.   This information is not intended to replace advice given to you by your health care provider. Make sure you discuss any questions you have with your health care provider.   Document Released: 05/22/2005 Document Revised: 07/29/2014 Document Reviewed: 10/20/2008 Elsevier Interactive Patient Education [employer].

## 2016-02-07 LAB — HEMOGLOBIN A1C
HEMOGLOBIN A1C: 6 % — AB (ref <5.7)
MEAN PLASMA GLUCOSE: 126 mg/dL

## 2016-02-07 LAB — PSA: PSA: 2.92 ng/mL (ref <=4.00)

## 2016-02-09 ENCOUNTER — Encounter: Payer: Self-pay | Admitting: *Deleted

## 2016-02-12 ENCOUNTER — Ambulatory Visit (INDEPENDENT_AMBULATORY_CARE_PROVIDER_SITE_OTHER): Payer: 59 | Admitting: General Surgery

## 2016-02-12 ENCOUNTER — Encounter: Payer: Self-pay | Admitting: General Surgery

## 2016-02-12 VITALS — BP 130/74 | HR 84 | Resp 12 | Ht 72.0 in | Wt 202.0 lb

## 2016-02-12 DIAGNOSIS — K429 Umbilical hernia without obstruction or gangrene: Secondary | ICD-10-CM

## 2016-02-12 NOTE — Patient Instructions (Addendum)
Umbilical Herniorrhaphy Herniorrhaphy is surgery to repair a hernia. A hernia is the protrusion of a part of an organ through an abdominal opening. An umbilical hernia means that your hernia is in the area around your navel. If the hernia is not repaired, the gap could get bigger. Your intestines or other tissues, such as fat, could get trapped in the gap. This can lead to other health problems, such as blocked intestines. If the hernia is fixed before problems set in, you may be allowed to go home the same day as the surgery (outpatient). LET [facility] CARE PROVIDER KNOW ABOUT:  Allergies to food or medicine.  Medicines taken, including vitamins, herbs, eye drops, over-the-counter medicines, and creams.  Use of steroids (by mouth or creams).  Previous problems with anesthetics or numbing medicines.  History of bleeding problems or blood clots.  Previous surgery.  Other health problems, including diabetes and kidney problems.  Possibility of pregnancy, if this applies. RISKS AND COMPLICATIONS  Pain.  Excessive bleeding.  Hematoma. This is a pocket of blood that collects under the surgery site.  Infection at the surgery site.  Numbness at the surgery site.  Swelling and bruising.  Blood clots.  Intestinal damage (rare).  Scarring.  Skin damage.  Development of another hernia. This may require another surgery. BEFORE THE PROCEDURE  Ask your health care provider about changing or stopping your regular medicines. You may need to stop taking aspirin, nonsteroidal anti-inflammatory drugs (NSAIDs), vitamin E, and blood thinners as early as 2 weeks before the procedure.  Do not eat or drink for 8 hours before the procedure, or as directed by your health care provider.  You might be asked to shower or wash with an antibacterial soap before the procedure.  Wear comfortable clothes that will be easy to put on after the procedure. PROCEDURE You will be given an intravenous  (IV) tube. A needle will be inserted in your arm. Medicine will flow directly into your body through this needle. You might be given medicine to help you relax (sedative). You will be given medicine that numbs the area (local anesthetic) or medicine that makes you sleep (general anesthetic). If you have open surgery:  The surgeon will make a cut (incision) in your abdomen.  The gap in the muscle wall will be repaired. The surgeon may sew the edges together over the gap or use a mesh material to strengthen the area. When mesh is used, the body grows new, strong tissue into and around it. This new tissue closes the gap.  A drain might be put in to remove excess fluid from the body after surgery.  The surgeon will close the incision with stitches, glue, or staples. If you have laparoscopic surgery:  The surgeon will make several small incisions in your abdomen.  A thin, lighted tube (laparoscope) will be inserted into the abdomen through an incision. A camera is attached to the laparoscope that allows the surgeon to see inside the abdomen.  Tools will be inserted through the other incisions to repair the hernia. Usually, mesh is used to cover the gap.  The surgeon will close the incisions with stitches. AFTER THE PROCEDURE  You will be taken to a recovery area. A nurse will watch and check your progress.  When you are awake, feeling well, and taking fluids well, you may be allowed to go home. In some cases, you may need to stay overnight in the hospital.  Arrange for someone to drive you home.  This information is not intended to replace advice given to you by your health care provider. Make sure you discuss any questions you have with your health care provider.   Document Released: 10/04/2008 Document Revised: 07/29/2014 Document Reviewed: 10/09/2011 Elsevier Interactive Patient Education [employer].  Patient is scheduled for surgery at [facility] on 02/27/16, he will pre admit by  phone. The patient is aware of date and instructions.

## 2016-02-12 NOTE — Progress Notes (Signed)
Patient ID: Francisco Gallagher, male   DOB: 1960-11-16, 55 y.o.   MRN: [id number]  Chief Complaint  Patient presents with  . Other    hernia    HPI Francisco Gallagher is a 55 y.o. male here today for an evaluation of an umbilical hernia. Patient states the hernia has been present for two years. He states it does protrude. Denies pain.  I have reviewed the history of present illness with the patient.  HPI  Past Medical History:  Diagnosis Date  . Arthritis   . Erectile dysfunction    Cialis PRN  . GERD (gastroesophageal reflux disease)   . Glucose intolerance (impaired glucose tolerance)   . Hyperlipidemia   . Hypertension   . Hypogonadism male 08/23/2011   s/p urology consult [facility]; no treatment indicated due to potential of pregnancy.  . Mild sleep apnea   . Multinodular goiter 07/23/2007   thyroid u/s: multinodular goiter; s/p ENT consult/[facility]:  Rx for Synthroid.  . Pain in joint, site unspecified   . Renal insufficiency   . Thyroid disease    per pt low thyroid  . Tobacco use disorder   . Unspecified disorder of skin and subcutaneous tissue   . Unspecified hypothyroidism     Past Surgical History:  Procedure Laterality Date  . CARDIAC CATHETERIZATION  07/23/1999   negative.  . CHOLECYSTECTOMY  2003  . excision nosdule   L shouldher Left 2 2014  . pilonydal cyst  1981  . Sleep Study  07/23/2007    Family History  Problem Relation Age of Onset  . Arthritis Mother   . Hypertension Mother   . COPD Mother   . Depression Mother   . Fibromyalgia Mother   . Gout Mother   . Kidney disease Mother   . Cancer Mother     lung  . Heart disease Mother 1    AMI s/p stenting  . Hypertension Father   . Benign prostatic hyperplasia Father   . Rosacea Father   . Arthritis Father   . Arthritis Brother   . Benign prostatic hyperplasia Brother   . Heart disease Maternal Grandfather   . Hypertension Paternal Grandmother   . Stroke Paternal Grandfather   . Diabetes    .  Colon cancer Neg Hx     Social History Social History  Substance Use Topics  . Smoking status: Never Smoker  . Smokeless tobacco: Current User    Types: Chew     Comment: PATIENT CHEWS TOBACCO  20 years  . Alcohol use Yes     Comment:  5 TIMES/YEAR - BEER AND LIQUOR    Allergies  Allergen Reactions  . Penicillins Rash    Current Outpatient Prescriptions  Medication Sig Dispense Refill  . aspirin 81 MG tablet Take 81 mg by mouth daily.    . diclofenac sodium (VOLTAREN) 1 % GEL Apply 2 g topically 4 (four) times daily. 100 g 5  . fenofibrate 160 MG tablet Take 1 tablet (160 mg total) by mouth daily. 90 tablet 3  . hydrochlorothiazide (HYDRODIURIL) 12.5 MG tablet Take 1 tablet (12.5 mg total) by mouth daily. 90 tablet 3  . irbesartan (AVAPRO) 300 MG tablet TAKE 1 TABLET (300 MG TOTAL) BY MOUTH DAILY. 90 tablet 3  . levothyroxine (SYNTHROID, LEVOTHROID) 125 MCG tablet Take 1 tablet (125 mcg total) by mouth daily before breakfast. 90 tablet 3  . pantoprazole (PROTONIX) 40 MG tablet Take 1 tablet (40 mg total) by mouth daily.  90 tablet 3  . tadalafil (CIALIS) 20 MG tablet Take 1 tablet (20 mg total) by mouth daily as needed. 8 tablet 11   No current facility-administered medications for this visit.     Review of Systems Review of Systems  Constitutional: Negative.   Respiratory: Negative.   Cardiovascular: Negative.     Blood pressure 130/74, pulse 84, resp. rate 12, height 6' (1.829 m), weight 202 lb (91.6 kg).  Physical Exam Physical Exam  Constitutional: He is oriented to person, place, and time. He appears well-developed and well-nourished.  Eyes: Conjunctivae are normal. No scleral icterus.  Cardiovascular: Normal rate, regular rhythm and normal heart sounds.   Pulmonary/Chest: Effort normal and breath sounds normal.  Abdominal: Soft. Bowel sounds are normal. A hernia is present.    Neurological: He is alert and oriented to person, place, and time.  Skin: Skin is  warm and dry.    Data Reviewed Notes reviewed.  Assessment    Umbilical hernia, moderate-sized with potential for incarceration     Plan    Recommend repair of hernia, patient agrees.  Hernia precautions and incarceration were discussed with the patient. If they develop symptoms of an incarcerated hernia, they were encouraged to seek prompt medical attention. I have recommended repair of the hernia using mesh on an outpatient basis in the near future. The risk of infection was reviewed. The role of prosthetic mesh to minimize the risk of recurrence was reviewed.  Patient is scheduled for surgery at [facility] on 02/27/16, he will pre admit by phone. The patient is aware of date and instructions.      PCP: Dr. Reginia Forts   Christene Lye 02/13/2016, 11:41 AM

## 2016-02-13 ENCOUNTER — Encounter: Payer: Self-pay | Admitting: General Surgery

## 2016-02-14 ENCOUNTER — Other Ambulatory Visit: Payer: Self-pay | Admitting: General Surgery

## 2016-02-14 DIAGNOSIS — K439 Ventral hernia without obstruction or gangrene: Secondary | ICD-10-CM

## 2016-02-19 ENCOUNTER — Encounter
Admission: RE | Admit: 2016-02-19 | Discharge: 2016-02-19 | Disposition: A | Discharge status: Home / Self Care | Payer: 59 | Source: Ambulatory Visit | Attending: General Surgery | Admitting: General Surgery

## 2016-02-19 NOTE — Patient Instructions (Signed)
  Your procedure is scheduled on: 02-27-16 (TUESDAY) Report to Same Day Surgery 2nd floor medical mall To find out your arrival time please call [phone] between 1PM - 3PM on 02-26-16 [facility])  Remember: Instructions that are not followed completely may result in serious medical risk, up to and including death, or upon the discretion of your surgeon and anesthesiologist your surgery may need to be rescheduled.    _x___ 1. Do not eat food or drink liquids after midnight. No gum chewing or hard candies.     __x__ 2. No Alcohol for 24 hours before or after surgery.   __x__3. No Smoking for 24 prior to surgery.   ____  4. Bring all medications with you on the day of surgery if instructed.    __x__ 5. Notify your doctor if there is any change in your medical condition     (cold, fever, infections).     Do not wear jewelry, make-up, hairpins, clips or nail polish.  Do not wear lotions, powders, or perfumes. You may wear deodorant.  Do not shave 48 hours prior to surgery. Men may shave face and neck.  Do not bring valuables to the hospital.    [facility] is not responsible for any belongings or valuables.               Contacts, dentures or bridgework may not be worn into surgery.  Leave your suitcase in the car. After surgery it may be brought to your room.  For patients admitted to the hospital, discharge time is determined by your treatment team.   Patients discharged the day of surgery will not be allowed to drive home.    Please read over the following fact sheets that you were given:   [facility] Preparing for Surgery and or MRSA Information   _x___ Take these medicines the morning of surgery with A SIP OF WATER:    1. AVAPRO  2. SYNTHROID  3. PROTONIX  4. TAKE A PROTONIX ON Monday NIGHT BEFORE BED  5.  6.  ____ Fleet Enema (as directed)   _x___ Use CHG Soap or sage wipes as directed on instruction sheet   ____ Use inhalers on the day of surgery and bring to hospital  day of surgery  ____ Stop metformin 2 days prior to surgery    ____ Take 1/2 of usual insulin dose the night before surgery and none on the morning of surgery.   _X___ Stop aspirin or coumadin, or plavix-STOP ASPIRIN NOW  _x__ Stop Anti-inflammatories such as Advil, Aleve, Ibuprofen, Motrin, Naproxen,          Naprosyn, Goodies powders or aspirin products. Ok to take Tylenol.   ____ Stop supplements until after surgery.    ____ Bring C-Pap to the hospital.

## 2016-02-21 ENCOUNTER — Encounter
Admission: RE | Admit: 2016-02-21 | Discharge: 2016-02-21 | Disposition: A | Discharge status: Home / Self Care | Payer: 59 | Source: Ambulatory Visit | Attending: General Surgery | Admitting: General Surgery

## 2016-02-21 ENCOUNTER — Other Ambulatory Visit: Payer: Self-pay

## 2016-02-21 DIAGNOSIS — N289 Disorder of kidney and ureter, unspecified: Secondary | ICD-10-CM | POA: Diagnosis not present

## 2016-02-21 DIAGNOSIS — Z833 Family history of diabetes mellitus: Secondary | ICD-10-CM | POA: Diagnosis not present

## 2016-02-21 DIAGNOSIS — E7439 Other disorders of intestinal carbohydrate absorption: Secondary | ICD-10-CM | POA: Diagnosis not present

## 2016-02-21 DIAGNOSIS — Z880 Allergy status to penicillin: Secondary | ICD-10-CM | POA: Diagnosis not present

## 2016-02-21 DIAGNOSIS — I10 Essential (primary) hypertension: Secondary | ICD-10-CM | POA: Diagnosis not present

## 2016-02-21 DIAGNOSIS — Z8261 Family history of arthritis: Secondary | ICD-10-CM | POA: Diagnosis not present

## 2016-02-21 DIAGNOSIS — K439 Ventral hernia without obstruction or gangrene: Secondary | ICD-10-CM | POA: Diagnosis not present

## 2016-02-21 DIAGNOSIS — K219 Gastro-esophageal reflux disease without esophagitis: Secondary | ICD-10-CM | POA: Diagnosis not present

## 2016-02-21 DIAGNOSIS — Z8349 Family history of other endocrine, nutritional and metabolic diseases: Secondary | ICD-10-CM | POA: Diagnosis not present

## 2016-02-21 DIAGNOSIS — Z79899 Other long term (current) drug therapy: Secondary | ICD-10-CM | POA: Diagnosis not present

## 2016-02-21 DIAGNOSIS — Z825 Family history of asthma and other chronic lower respiratory diseases: Secondary | ICD-10-CM | POA: Diagnosis not present

## 2016-02-21 DIAGNOSIS — Z8249 Family history of ischemic heart disease and other diseases of the circulatory system: Secondary | ICD-10-CM | POA: Diagnosis not present

## 2016-02-21 DIAGNOSIS — E785 Hyperlipidemia, unspecified: Secondary | ICD-10-CM | POA: Diagnosis not present

## 2016-02-21 DIAGNOSIS — M1990 Unspecified osteoarthritis, unspecified site: Secondary | ICD-10-CM | POA: Diagnosis not present

## 2016-02-21 DIAGNOSIS — G4730 Sleep apnea, unspecified: Secondary | ICD-10-CM | POA: Diagnosis not present

## 2016-02-21 DIAGNOSIS — Z9049 Acquired absence of other specified parts of digestive tract: Secondary | ICD-10-CM | POA: Diagnosis not present

## 2016-02-21 DIAGNOSIS — E049 Nontoxic goiter, unspecified: Secondary | ICD-10-CM | POA: Diagnosis not present

## 2016-02-21 DIAGNOSIS — E039 Hypothyroidism, unspecified: Secondary | ICD-10-CM | POA: Diagnosis not present

## 2016-02-21 DIAGNOSIS — Z823 Family history of stroke: Secondary | ICD-10-CM | POA: Diagnosis not present

## 2016-02-21 DIAGNOSIS — F419 Anxiety disorder, unspecified: Secondary | ICD-10-CM | POA: Diagnosis not present

## 2016-02-21 DIAGNOSIS — F17200 Nicotine dependence, unspecified, uncomplicated: Secondary | ICD-10-CM | POA: Diagnosis not present

## 2016-02-27 ENCOUNTER — Encounter
Admission: RE | Disposition: A | Discharge status: Home / Self Care | Payer: Self-pay | Source: Ambulatory Visit | Attending: General Surgery

## 2016-02-27 ENCOUNTER — Ambulatory Visit: Payer: 59 | Admitting: Anesthesiology

## 2016-02-27 ENCOUNTER — Encounter: Payer: Self-pay | Admitting: *Deleted

## 2016-02-27 ENCOUNTER — Ambulatory Visit
Admission: RE | Admit: 2016-02-27 | Discharge: 2016-02-27 | Disposition: A | Discharge status: Home / Self Care | Payer: 59 | Source: Ambulatory Visit | Attending: General Surgery | Admitting: General Surgery

## 2016-02-27 DIAGNOSIS — Z8349 Family history of other endocrine, nutritional and metabolic diseases: Secondary | ICD-10-CM | POA: Insufficient documentation

## 2016-02-27 DIAGNOSIS — Z825 Family history of asthma and other chronic lower respiratory diseases: Secondary | ICD-10-CM | POA: Insufficient documentation

## 2016-02-27 DIAGNOSIS — E049 Nontoxic goiter, unspecified: Secondary | ICD-10-CM | POA: Insufficient documentation

## 2016-02-27 DIAGNOSIS — M1990 Unspecified osteoarthritis, unspecified site: Secondary | ICD-10-CM | POA: Insufficient documentation

## 2016-02-27 DIAGNOSIS — F419 Anxiety disorder, unspecified: Secondary | ICD-10-CM | POA: Insufficient documentation

## 2016-02-27 DIAGNOSIS — Z8261 Family history of arthritis: Secondary | ICD-10-CM | POA: Insufficient documentation

## 2016-02-27 DIAGNOSIS — F17200 Nicotine dependence, unspecified, uncomplicated: Secondary | ICD-10-CM | POA: Insufficient documentation

## 2016-02-27 DIAGNOSIS — I10 Essential (primary) hypertension: Secondary | ICD-10-CM | POA: Insufficient documentation

## 2016-02-27 DIAGNOSIS — Z823 Family history of stroke: Secondary | ICD-10-CM | POA: Insufficient documentation

## 2016-02-27 DIAGNOSIS — Z9049 Acquired absence of other specified parts of digestive tract: Secondary | ICD-10-CM | POA: Insufficient documentation

## 2016-02-27 DIAGNOSIS — E785 Hyperlipidemia, unspecified: Secondary | ICD-10-CM | POA: Insufficient documentation

## 2016-02-27 DIAGNOSIS — N289 Disorder of kidney and ureter, unspecified: Secondary | ICD-10-CM | POA: Insufficient documentation

## 2016-02-27 DIAGNOSIS — K439 Ventral hernia without obstruction or gangrene: Secondary | ICD-10-CM

## 2016-02-27 DIAGNOSIS — K432 Incisional hernia without obstruction or gangrene: Secondary | ICD-10-CM | POA: Diagnosis not present

## 2016-02-27 DIAGNOSIS — Z833 Family history of diabetes mellitus: Secondary | ICD-10-CM | POA: Insufficient documentation

## 2016-02-27 DIAGNOSIS — G4730 Sleep apnea, unspecified: Secondary | ICD-10-CM | POA: Insufficient documentation

## 2016-02-27 DIAGNOSIS — Z8249 Family history of ischemic heart disease and other diseases of the circulatory system: Secondary | ICD-10-CM | POA: Insufficient documentation

## 2016-02-27 DIAGNOSIS — Z79899 Other long term (current) drug therapy: Secondary | ICD-10-CM | POA: Insufficient documentation

## 2016-02-27 DIAGNOSIS — E039 Hypothyroidism, unspecified: Secondary | ICD-10-CM | POA: Insufficient documentation

## 2016-02-27 DIAGNOSIS — Z880 Allergy status to penicillin: Secondary | ICD-10-CM | POA: Insufficient documentation

## 2016-02-27 DIAGNOSIS — E7439 Other disorders of intestinal carbohydrate absorption: Secondary | ICD-10-CM | POA: Insufficient documentation

## 2016-02-27 DIAGNOSIS — K219 Gastro-esophageal reflux disease without esophagitis: Secondary | ICD-10-CM | POA: Insufficient documentation

## 2016-02-27 HISTORY — PX: INSERTION OF MESH: SHX5868

## 2016-02-27 HISTORY — PX: VENTRAL HERNIA REPAIR: SHX424

## 2016-02-27 SURGERY — REPAIR, HERNIA, VENTRAL
Anesthesia: General | Wound class: Clean

## 2016-02-27 MED ORDER — BUPIVACAINE HCL (PF) 0.5 % IJ SOLN
INTRAMUSCULAR | Status: DC | PRN
  Administered 2016-02-27: 10 mL

## 2016-02-27 MED ORDER — CEFAZOLIN SODIUM-DEXTROSE 2-4 GM/100ML-% IV SOLN
2.0000 g | INTRAVENOUS | Status: AC
  Administered 2016-02-27: 2 g via INTRAVENOUS

## 2016-02-27 MED ORDER — ONDANSETRON HCL 4 MG/2ML IJ SOLN: 4.0000 mg | Freq: Once | INTRAMUSCULAR | Status: DC | PRN

## 2016-02-27 MED ORDER — ACETAMINOPHEN 10 MG/ML IV SOLN
INTRAVENOUS | Status: AC
  Filled 2016-02-27: qty 100

## 2016-02-27 MED ORDER — PROPOFOL 10 MG/ML IV BOLUS
INTRAVENOUS | Status: DC | PRN
  Administered 2016-02-27: 150 mg via INTRAVENOUS

## 2016-02-27 MED ORDER — ONDANSETRON HCL 4 MG/2ML IJ SOLN
INTRAMUSCULAR | Status: DC | PRN
  Administered 2016-02-27: 4 mg via INTRAVENOUS

## 2016-02-27 MED ORDER — FENTANYL CITRATE (PF) 100 MCG/2ML IJ SOLN
INTRAMUSCULAR | Status: AC
  Filled 2016-02-27: qty 2

## 2016-02-27 MED ORDER — LACTATED RINGERS IV SOLN
INTRAVENOUS | Status: DC
  Administered 2016-02-27: 50 mL/h via INTRAVENOUS

## 2016-02-27 MED ORDER — CEFAZOLIN SODIUM-DEXTROSE 2-4 GM/100ML-% IV SOLN
INTRAVENOUS | Status: AC
  Administered 2016-02-27: 2 g via INTRAVENOUS
  Filled 2016-02-27: qty 100

## 2016-02-27 MED ORDER — ACETAMINOPHEN 10 MG/ML IV SOLN
INTRAVENOUS | Status: DC | PRN
  Administered 2016-02-27: 1000 mg via INTRAVENOUS

## 2016-02-27 MED ORDER — GLYCOPYRROLATE 0.2 MG/ML IJ SOLN
INTRAMUSCULAR | Status: DC | PRN
  Administered 2016-02-27: .6 mg via INTRAVENOUS

## 2016-02-27 MED ORDER — OXYCODONE HCL 5 MG PO TABS
ORAL_TABLET | ORAL | Status: AC
  Filled 2016-02-27: qty 1

## 2016-02-27 MED ORDER — CHLORHEXIDINE GLUCONATE CLOTH 2 % EX PADS: 6.0000 | MEDICATED_PAD | Freq: Once | CUTANEOUS | Status: DC

## 2016-02-27 MED ORDER — NEOSTIGMINE METHYLSULFATE 10 MG/10ML IV SOLN
INTRAVENOUS | Status: DC | PRN
  Administered 2016-02-27: 3 mg via INTRAVENOUS

## 2016-02-27 MED ORDER — EPHEDRINE SULFATE 50 MG/ML IJ SOLN
INTRAMUSCULAR | Status: DC | PRN
  Administered 2016-02-27 (×2): 5 mg via INTRAVENOUS

## 2016-02-27 MED ORDER — MIDAZOLAM HCL 2 MG/2ML IJ SOLN
INTRAMUSCULAR | Status: DC | PRN
  Administered 2016-02-27: 2 mg via INTRAVENOUS

## 2016-02-27 MED ORDER — FENTANYL CITRATE (PF) 100 MCG/2ML IJ SOLN
25.0000 ug | INTRAMUSCULAR | Status: AC | PRN
  Administered 2016-02-27 (×6): 25 ug via INTRAVENOUS

## 2016-02-27 MED ORDER — OXYCODONE-ACETAMINOPHEN 5-325 MG PO TABS
1.0000 | ORAL_TABLET | ORAL | Status: DC | PRN
  Administered 2016-02-27: 1 via ORAL

## 2016-02-27 MED ORDER — FENTANYL CITRATE (PF) 100 MCG/2ML IJ SOLN
INTRAMUSCULAR | Status: DC | PRN
  Administered 2016-02-27: 50 ug via INTRAVENOUS
  Administered 2016-02-27: 100 ug via INTRAVENOUS
  Administered 2016-02-27: 50 ug via INTRAVENOUS

## 2016-02-27 MED ORDER — SUCCINYLCHOLINE CHLORIDE 20 MG/ML IJ SOLN
INTRAMUSCULAR | Status: DC | PRN
  Administered 2016-02-27: 100 mg via INTRAVENOUS

## 2016-02-27 MED ORDER — OXYCODONE-ACETAMINOPHEN 5-325 MG PO TABS: 1.0000 | ORAL_TABLET | ORAL | 0 refills | Status: DC | PRN

## 2016-02-27 MED ORDER — ROCURONIUM BROMIDE 100 MG/10ML IV SOLN
INTRAVENOUS | Status: DC | PRN
  Administered 2016-02-27: 30 mg via INTRAVENOUS
  Administered 2016-02-27 (×3): 10 mg via INTRAVENOUS

## 2016-02-27 MED ORDER — LIDOCAINE HCL (PF) 1 % IJ SOLN
INTRAMUSCULAR | Status: AC
  Filled 2016-02-27: qty 30

## 2016-02-27 MED ORDER — LIDOCAINE HCL (CARDIAC) 20 MG/ML IV SOLN
INTRAVENOUS | Status: DC | PRN
  Administered 2016-02-27: 100 mg via INTRAVENOUS

## 2016-02-27 MED ORDER — LIDOCAINE HCL (PF) 1 % IJ SOLN
INTRAMUSCULAR | Status: DC | PRN
  Administered 2016-02-27: 10 mL

## 2016-02-27 MED ORDER — BUPIVACAINE HCL (PF) 0.5 % IJ SOLN
INTRAMUSCULAR | Status: AC
  Filled 2016-02-27: qty 30

## 2016-02-27 SURGICAL SUPPLY — 37 items
BLADE SURG 15 STRL SS SAFETY (BLADE) ×2 IMPLANT
CANISTER SUCT 1200ML W/VALVE (MISCELLANEOUS) ×2 IMPLANT
CHLORAPREP W/TINT 26ML (MISCELLANEOUS) ×2 IMPLANT
DECANTER SPIKE VIAL GLASS SM (MISCELLANEOUS) IMPLANT
DRAPE LAPAROTOMY 100X77 ABD (DRAPES) ×2 IMPLANT
DRESSING TELFA 4X3 1S ST N-ADH (GAUZE/BANDAGES/DRESSINGS) IMPLANT
DRSG TEGADERM 4X4.75 (GAUZE/BANDAGES/DRESSINGS) IMPLANT
ELECT REM PT RETURN 9FT ADLT (ELECTROSURGICAL) ×2
ELECTRODE REM PT RTRN 9FT ADLT (ELECTROSURGICAL) ×1 IMPLANT
GLOVE BIO SURGEON STRL SZ7 (GLOVE) ×10 IMPLANT
GOWN STRL REUS W/ TWL LRG LVL3 (GOWN DISPOSABLE) ×3 IMPLANT
GOWN STRL REUS W/TWL LRG LVL3 (GOWN DISPOSABLE) ×3
KIT RM TURNOVER STRD PROC AR (KITS) ×2 IMPLANT
LABEL OR SOLS (LABEL) ×2 IMPLANT
LIQUID BAND (GAUZE/BANDAGES/DRESSINGS) ×2 IMPLANT
MESH VENTRALEX ST 2.5 CRC MED (Mesh General) ×2 IMPLANT
NDL HPO THNWL 1X22GA REG BVL (NEEDLE) ×1 IMPLANT
NEEDLE HYPO 25X1 1.5 SAFETY (NEEDLE) ×2 IMPLANT
NEEDLE SAFETY 22GX1 (NEEDLE) ×1
NS IRRIG 500ML POUR BTL (IV SOLUTION) ×2 IMPLANT
PACK BASIN MINOR ARMC (MISCELLANEOUS) ×2 IMPLANT
SPONGE KITTNER 5P (MISCELLANEOUS) ×2 IMPLANT
SPONGE LAP 18X18 5 PK (GAUZE/BANDAGES/DRESSINGS) ×2 IMPLANT
STRIP CLOSURE SKIN 1/2X4 (GAUZE/BANDAGES/DRESSINGS) IMPLANT
SUT PROLENE 0 CT 1 30 (SUTURE) ×2 IMPLANT
SUT PROLENE 0 CT 2 (SUTURE) ×2 IMPLANT
SUT VIC AB 2-0 CT1 27 (SUTURE) ×1
SUT VIC AB 2-0 CT1 TAPERPNT 27 (SUTURE) ×1 IMPLANT
SUT VIC AB 2-0 SH 27 (SUTURE) ×1
SUT VIC AB 2-0 SH 27XBRD (SUTURE) ×1 IMPLANT
SUT VIC AB 3-0 SH 27 (SUTURE) ×1
SUT VIC AB 3-0 SH 27X BRD (SUTURE) ×1 IMPLANT
SUT VIC AB 4-0 FS2 27 (SUTURE) ×2 IMPLANT
SUT VICRYL+ 3-0 144IN (SUTURE) IMPLANT
SWABSTK COMLB BENZOIN TINCTURE (MISCELLANEOUS) IMPLANT
SYR CONTROL 10ML (SYRINGE) ×2 IMPLANT
SYRINGE 10CC LL (SYRINGE) ×2 IMPLANT

## 2016-02-27 NOTE — Interval H&P Note (Signed)
History and Physical Interval Note:  02/27/2016 12:46 PM  Francisco Gallagher  has presented today for surgery, with the diagnosis of VENTRAL HERNIA  The various methods of treatment have been discussed with the patient and family. After consideration of risks, benefits and other options for treatment, the patient has consented to  Procedure(s): HERNIA REPAIR VENTRAL ADULT (N/A) as a surgical intervention .  [name] history has been reviewed, patient examined, no change in status, stable for surgery.  I have reviewed the patient's chart and labs.  Questions were answered to the patient's satisfaction.     Marcellina Jonsson G

## 2016-02-27 NOTE — Discharge Instructions (Signed)
Open Hernia Repair, Care After These instructions give you information about caring for yourself after your procedure. Your doctor may also give you more specific instructions. Call your doctor if you have any problems or questions after your procedure. HOME CARE  Keep the cut (incision) area clean and dry. You may gently wash the incision area with soap and water 48 hours after surgery. To dry the incision area, gently blot or dab it.  Do not take baths, swim, or use a hot tub for 10 days or until your doctor approves.  Change bandages (dressings) as told by your doctor.  Check your incision area every day for signs of infection. Watch for:  Redness, swelling, or pain.  Fluid , blood, or pus.  Eat plenty of fruits and vegetables. This helps to prevent constipation.  Drink enough fluid to keep your pee (urine) clear or pale yellow. This also helps to prevent constipation.  Do not drive or operate heavy machinery until your doctor says it is okay.  Do not lift anything that is heavier than 10 lb (4.5 kg) until your doctor approves.  Do not play contact sports for 4 weeks or until your doctor approves.  Take medicines only as told by your doctor.  Keep all follow-up visits as told by your doctor. This is important. Ask your doctor when to make an appointment to have your stitches (sutures) or staples removed. GET HELP IF:  The incision is bleeding more than before.  You have blood in your poop (stool).  The incision hurts more than before.  You have redness, swelling, or pain in your incision area.  You have fluid, blood, or pus coming from your incision.  You have a fever.  You notice a bad smell coming from the incision area or the dressing. GET HELP RIGHT AWAY IF:  You have a rash.  Your chest hurts.  You are short of breath.  You feel light-headed.  You feel weak and dizzy (feel faint).   This information is not intended to replace advice given to you by your  health care provider. Make sure you discuss any questions you have with your health care provider.   Document Released: 07/29/2014 Document Reviewed: 07/29/2014 Elsevier Interactive Patient Education [employer].

## 2016-02-27 NOTE — H&P (View-Only) (Signed)
Patient ID: Francisco Gallagher, male   DOB: 1960-11-16, 55 y.o.   MRN: [id number]  Chief Complaint  Patient presents with  . Other    hernia    HPI Francisco Gallagher is a 55 y.o. male here today for an evaluation of an umbilical hernia. Patient states the hernia has been present for two years. He states it does protrude. Denies pain.  I have reviewed the history of present illness with the patient.  HPI  Past Medical History:  Diagnosis Date  . Arthritis   . Erectile dysfunction    Cialis PRN  . GERD (gastroesophageal reflux disease)   . Glucose intolerance (impaired glucose tolerance)   . Hyperlipidemia   . Hypertension   . Hypogonadism male 08/23/2011   s/p urology consult [facility]; no treatment indicated due to potential of pregnancy.  . Mild sleep apnea   . Multinodular goiter 07/23/2007   thyroid u/s: multinodular goiter; s/p ENT consult/[facility]:  Rx for Synthroid.  . Pain in joint, site unspecified   . Renal insufficiency   . Thyroid disease    per pt low thyroid  . Tobacco use disorder   . Unspecified disorder of skin and subcutaneous tissue   . Unspecified hypothyroidism     Past Surgical History:  Procedure Laterality Date  . CARDIAC CATHETERIZATION  07/23/1999   negative.  . CHOLECYSTECTOMY  2003  . excision nosdule   L shouldher Left 2 2014  . pilonydal cyst  1981  . Sleep Study  07/23/2007    Family History  Problem Relation Age of Onset  . Arthritis Mother   . Hypertension Mother   . COPD Mother   . Depression Mother   . Fibromyalgia Mother   . Gout Mother   . Kidney disease Mother   . Cancer Mother     lung  . Heart disease Mother 1    AMI s/p stenting  . Hypertension Father   . Benign prostatic hyperplasia Father   . Rosacea Father   . Arthritis Father   . Arthritis Brother   . Benign prostatic hyperplasia Brother   . Heart disease Maternal Grandfather   . Hypertension Paternal Grandmother   . Stroke Paternal Grandfather   . Diabetes    .  Colon cancer Neg Hx     Social History Social History  Substance Use Topics  . Smoking status: Never Smoker  . Smokeless tobacco: Current User    Types: Chew     Comment: PATIENT CHEWS TOBACCO  20 years  . Alcohol use Yes     Comment:  5 TIMES/YEAR - BEER AND LIQUOR    Allergies  Allergen Reactions  . Penicillins Rash    Current Outpatient Prescriptions  Medication Sig Dispense Refill  . aspirin 81 MG tablet Take 81 mg by mouth daily.    . diclofenac sodium (VOLTAREN) 1 % GEL Apply 2 g topically 4 (four) times daily. 100 g 5  . fenofibrate 160 MG tablet Take 1 tablet (160 mg total) by mouth daily. 90 tablet 3  . hydrochlorothiazide (HYDRODIURIL) 12.5 MG tablet Take 1 tablet (12.5 mg total) by mouth daily. 90 tablet 3  . irbesartan (AVAPRO) 300 MG tablet TAKE 1 TABLET (300 MG TOTAL) BY MOUTH DAILY. 90 tablet 3  . levothyroxine (SYNTHROID, LEVOTHROID) 125 MCG tablet Take 1 tablet (125 mcg total) by mouth daily before breakfast. 90 tablet 3  . pantoprazole (PROTONIX) 40 MG tablet Take 1 tablet (40 mg total) by mouth daily.  90 tablet 3  . tadalafil (CIALIS) 20 MG tablet Take 1 tablet (20 mg total) by mouth daily as needed. 8 tablet 11   No current facility-administered medications for this visit.     Review of Systems Review of Systems  Constitutional: Negative.   Respiratory: Negative.   Cardiovascular: Negative.     Blood pressure 130/74, pulse 84, resp. rate 12, height 6' (1.829 m), weight 202 lb (91.6 kg).  Physical Exam Physical Exam  Constitutional: He is oriented to person, place, and time. He appears well-developed and well-nourished.  Eyes: Conjunctivae are normal. No scleral icterus.  Cardiovascular: Normal rate, regular rhythm and normal heart sounds.   Pulmonary/Chest: Effort normal and breath sounds normal.  Abdominal: Soft. Bowel sounds are normal. A hernia is present.    Neurological: He is alert and oriented to person, place, and time.  Skin: Skin is  warm and dry.    Data Reviewed Notes reviewed.  Assessment    Umbilical hernia, moderate-sized with potential for incarceration     Plan    Recommend repair of hernia, patient agrees.  Hernia precautions and incarceration were discussed with the patient. If they develop symptoms of an incarcerated hernia, they were encouraged to seek prompt medical attention. I have recommended repair of the hernia using mesh on an outpatient basis in the near future. The risk of infection was reviewed. The role of prosthetic mesh to minimize the risk of recurrence was reviewed.  Patient is scheduled for surgery at [facility] on 02/27/16, he will pre admit by phone. The patient is aware of date and instructions.      PCP: Dr. Reginia Forts   Christene Lye 02/13/2016, 11:41 AM

## 2016-02-27 NOTE — Op Note (Signed)
Preop diagnosis: Ventral hernia  Post op diagnosis: Same  Operation: Repair of ventral hernia with mesh  Surgeon: Mckinley Jewel    Assistant:     Anesthesia: Gen.  Complications: None  EBL: Minimal  Drains: None  Description: Patient was put to sleep in the supine position and the abdomen prepped and draped as sterile field. Timeout was performed. Patient had a hernia located right in the middle portion the umbilicus and a vertical incision encircling the umbilicus on the right side was made. Patient had a previous port incision at this site and likely this was a hernia resulting from that. Incision was deepened through to expose the peritoneal sac which was then dissected free down to the fascial opening. Further dissection revealed patient had a now the pocket of herniation of omentum to the left of the fascial opening and after freeing all this and freeing of the omentum the excess of the sac was removed. A small portion of the omentum appeared to be somewhat scarred at and at this was removed and ligated with 3-0 Vicryl. The fascial opening was about 2 cm to 2-1/2 cm transverse orientation. To obtain an adequate repair the mesh was decided on. Accordingly the peritoneum was incised underneath the edge of the fascial opening and with the use of blunt and sharp dissection a preperitoneal space was created all around. Peritoneal opening was then closed with a running suture of 2-0 Vicryl. This allowed for placement of the mesh in the preperitoneal space. A ventralex ST circular 6 cm patch was brought up to the field. This was position in the preperitoneal space and the straps were pulled up and make it snug. Axis of this strap was excised out. Fascial opening was then closed with interrupted figure-of-eight 0 proline stitches using incorporating a portion of the anterior strap. Repair was noted be adequate. Wound was irrigated and closed. 3-0 Vicryl placed the subcutaneous tissue. Skin was closed  with subcuticular 4-0 Vicryl in a running fashion and covered with liquid ban. Procedure was tolerated with no immediate problems and he was extubated and returned recovery room stable condition.

## 2016-02-27 NOTE — Transfer of Care (Signed)
Immediate Anesthesia Transfer of Care Note  Patient: Francisco Gallagher  Procedure(s) Performed: Procedure(s): HERNIA REPAIR VENTRAL ADULT (N/A) INSERTION OF MESH (N/A)  Patient Location: PACU  Anesthesia Type:General  Level of Consciousness: sedated  Airway & Oxygen Therapy: Patient Spontanous Breathing and Patient connected to face mask oxygen  Post-op Assessment: Report given to RN and Post -op Vital signs reviewed and stable  Post vital signs: Reviewed and stable  Last Vitals:  Vitals:   02/27/16 1223 02/27/16 1437  BP: 140/79 106/61  Pulse: 92 (!) 45  Resp: 17 (!) 7  Temp: (!) 35.7 C     Last Pain:  Vitals:   02/27/16 1223  TempSrc: Tympanic      Patients Stated Pain Goal: 0 ([id number])  Complications: No apparent anesthesia complications

## 2016-02-27 NOTE — Anesthesia Procedure Notes (Signed)
Procedure Name: Intubation Performed by: Lance Muss Pre-anesthesia Checklist: Patient identified, Patient being monitored, Timeout performed, Emergency Drugs available and Suction available Patient Re-evaluated:Patient Re-evaluated prior to inductionOxygen Delivery Method: Circle system utilized Preoxygenation: Pre-oxygenation with 100% oxygen Intubation Type: IV induction Ventilation: Mask ventilation without difficulty and Oral airway inserted - appropriate to patient size Laryngoscope Size: Mac and 3 Grade View: Grade II Tube type: Oral Tube size: 7.5 mm Number of attempts: 1 Airway Equipment and Method: Stylet and Bougie stylet Placement Confirmation: ETT inserted through vocal cords under direct vision,  positive ETCO2 and breath sounds checked- equal and bilateral Secured at: 23 cm Tube secured with: Tape Dental Injury: Teeth and Oropharynx as per pre-operative assessment

## 2016-02-27 NOTE — Anesthesia Preprocedure Evaluation (Signed)
Anesthesia Evaluation  Patient identified by MRN, date of birth, ID band Patient awake    Reviewed: Allergy & Precautions  Airway Mallampati: III  TM Distance: <3 FB Neck ROM: Full    Dental  (+) Caps, Chipped   Pulmonary shortness of breath and with exertion, sleep apnea ,    Pulmonary exam normal        Cardiovascular hypertension, Pt. on medications Normal cardiovascular exam     Neuro/Psych Anxiety    GI/Hepatic Neg liver ROS, GERD  Medicated and Controlled,Ventral hernia   Endo/Other  Hypothyroidism   Renal/GU Renal InsufficiencyRenal disease  negative genitourinary   Musculoskeletal  (+) Arthritis , Osteoarthritis,    Abdominal Normal abdominal exam  (+)   Peds negative pediatric ROS (+)  Hematology negative hematology ROS (+)   Anesthesia Other Findings   Reproductive/Obstetrics                             Anesthesia Physical Anesthesia Plan  ASA: III  Anesthesia Plan: General   Post-op Pain Management:    Induction: Intravenous  Airway Management Planned: Oral ETT  Additional Equipment:   Intra-op Plan:   Post-operative Plan: Extubation in OR  Informed Consent: I have reviewed the patients History and Physical, chart, labs and discussed the procedure including the risks, benefits and alternatives for the proposed anesthesia with the patient or authorized representative who has indicated his/her understanding and acceptance.   Dental advisory given  Plan Discussed with: CRNA and Surgeon  Anesthesia Plan Comments:         Anesthesia Quick Evaluation

## 2016-02-28 ENCOUNTER — Encounter: Payer: Self-pay | Admitting: General Surgery

## 2016-02-29 NOTE — Anesthesia Postprocedure Evaluation (Signed)
Anesthesia Post Note  Patient: Eluzer K Terra  Procedure(s) Performed: Procedure(s) (LRB): HERNIA REPAIR VENTRAL ADULT (N/A) INSERTION OF MESH (N/A)  Patient location during evaluation: PACU Anesthesia Type: General Level of consciousness: awake and alert and oriented Pain management: pain level controlled Vital Signs Assessment: post-procedure vital signs reviewed and stable Respiratory status: spontaneous breathing Cardiovascular status: blood pressure returned to baseline Anesthetic complications: no    Last Vitals:  Vitals:   02/27/16 1611 02/27/16 1616  BP: 130/81 123/81  Pulse: (!) 49 (!) 49  Resp: 16   Temp: 37.1 C     Last Pain:  Vitals:   02/28/16 0846  TempSrc:   PainSc: 3                  Oanh Devivo

## 2016-03-04 ENCOUNTER — Encounter: Payer: Self-pay | Admitting: General Surgery

## 2016-03-04 ENCOUNTER — Ambulatory Visit (INDEPENDENT_AMBULATORY_CARE_PROVIDER_SITE_OTHER): Payer: 59 | Admitting: General Surgery

## 2016-03-04 VITALS — BP 130/76 | HR 76 | Resp 12 | Ht 72.0 in | Wt 214.0 lb

## 2016-03-04 DIAGNOSIS — K429 Umbilical hernia without obstruction or gangrene: Secondary | ICD-10-CM

## 2016-03-04 NOTE — Patient Instructions (Signed)
Patient to return in  One week

## 2016-03-04 NOTE — Progress Notes (Signed)
Patient ID: Francisco Gallagher, male   DOB: 1960-12-30, 55 y.o.   MRN: [id number]  Chief Complaint  Patient presents with  . Routine Post Op    ventral repair     HPI Francisco Gallagher is a 55 y.o. male is here today for a follow up on an umbilical hernia repair done on 02/27/16. Patient states he is still very sore but is improving. I have reviewed the history of present illness with the patient.  Marland KitchenHPI  Past Medical History:  Diagnosis Date  . Arthritis    knees  . Erectile dysfunction    Cialis PRN  . GERD (gastroesophageal reflux disease)   . Glucose intolerance (impaired glucose tolerance)   . Hyperlipidemia   . Hypertension   . Hypogonadism male 08/23/2011   s/p urology consult [facility]; no treatment indicated due to potential of pregnancy.  . Mild sleep apnea 2009   NEVER WAS GIVEN CPAP PER PT  . Multinodular goiter 07/23/2007   thyroid u/s: multinodular goiter; s/p ENT consult/[facility]:  Rx for Synthroid.  . Pain in joint, site unspecified   . Renal insufficiency    CYST ON KIDNEYS  . Thyroid disease    per pt low thyroid  . Tobacco use disorder   . Unspecified disorder of skin and subcutaneous tissue   . Unspecified hypothyroidism     Past Surgical History:  Procedure Laterality Date  . CARDIAC CATHETERIZATION  07/23/1999   negative.  . CHOLECYSTECTOMY  2003  . excision nosdule   L shouldher Left 2 2014  . INSERTION OF MESH N/A 02/27/2016   Procedure: INSERTION OF MESH;  Surgeon: Christene Lye, MD;  Location: [facility] ORS;  Service: General;  Laterality: N/A;  . pilonydal cyst  1981  . Sleep Study  07/23/2007  . VENTRAL HERNIA REPAIR N/A 02/27/2016   Procedure: HERNIA REPAIR VENTRAL ADULT;  Surgeon: Christene Lye, MD;  Location: [facility];  Service: General;  Laterality: N/A;    Family History  Problem Relation Age of Onset  . Arthritis Mother   . Hypertension Mother   . COPD Mother   . Depression Mother   . Fibromyalgia Mother   . Gout Mother   .  Kidney disease Mother   . Cancer Mother     lung  . Heart disease Mother 38    AMI s/p stenting  . Hypertension Father   . Benign prostatic hyperplasia Father   . Rosacea Father   . Arthritis Father   . Arthritis Brother   . Benign prostatic hyperplasia Brother   . Heart disease Maternal Grandfather   . Hypertension Paternal Grandmother   . Stroke Paternal Grandfather   . Diabetes    . Colon cancer Neg Hx     Social History Social History  Substance Use Topics  . Smoking status: Never Smoker  . Smokeless tobacco: Current User    Types: Chew     Comment: PATIENT CHEWS TOBACCO  20 years  . Alcohol use Yes     Comment:  5 TIMES/YEAR - BEER AND LIQUOR    Allergies  Allergen Reactions  . Penicillins Rash    Has patient had a PCN reaction causing immediate rash, facial/tongue/throat swelling, SOB or lightheadedness with hypotension: rash Has patient had a PCN reaction causing severe rash involving mucus membranes or skin necrosis: no Has patient had a PCN reaction that required hospitalization no Has patient had a PCN reaction occurring within the last 10 years:no If all of  the above answers are "NO", then may proceed with Cephalosporin use.    Current Outpatient Prescriptions  Medication Sig Dispense Refill  . aspirin 81 MG tablet Take 81 mg by mouth as needed.     . diclofenac sodium (VOLTAREN) 1 % GEL Apply 2 g topically 4 (four) times daily. 100 g 5  . fenofibrate 160 MG tablet Take 1 tablet (160 mg total) by mouth daily. (Patient taking differently: Take 160 mg by mouth every evening. ) 90 tablet 3  . hydrochlorothiazide (HYDRODIURIL) 12.5 MG tablet Take 1 tablet (12.5 mg total) by mouth daily. 90 tablet 3  . irbesartan (AVAPRO) 300 MG tablet TAKE 1 TABLET (300 MG TOTAL) BY MOUTH DAILY. (Patient taking differently: Take 300 mg by mouth every morning. TAKE 1 TABLET (300 MG TOTAL) BY MOUTH DAILY.) 90 tablet 3  . levothyroxine (SYNTHROID, LEVOTHROID) 125 MCG tablet Take 1  tablet (125 mcg total) by mouth daily before breakfast. 90 tablet 3  . Multiple Vitamin (MULTIVITAMIN WITH MINERALS) TABS tablet Take 1 tablet by mouth daily.    Marland Kitchen oxyCODONE-acetaminophen (ROXICET) 5-325 MG tablet Take 1 tablet by mouth every 4 (four) hours as needed. 30 tablet 0  . pantoprazole (PROTONIX) 40 MG tablet Take 1 tablet (40 mg total) by mouth daily. (Patient taking differently: Take 40 mg by mouth every other day. ) 90 tablet 3  . tadalafil (CIALIS) 20 MG tablet Take 1 tablet (20 mg total) by mouth daily as needed. 8 tablet 11   No current facility-administered medications for this visit.     Review of Systems Review of Systems  Constitutional: Negative.   Respiratory: Negative.   Cardiovascular: Negative.     Blood pressure 130/76, pulse 76, resp. rate 12, height 6' (1.829 m), weight 214 lb (97.1 kg).  Physical Exam Physical Exam  Constitutional: He is oriented to person, place, and time. He appears well-developed and well-nourished.  Abdominal: Soft. Normal appearance. He exhibits no distension. There is no tenderness. There is no rebound and no guarding. No hernia.    Neurological: He is alert and oriented to person, place, and time.  Skin: Skin is warm and dry.    Data Reviewed  Prior notes.  Assessment     Ventral hernia, repaired. Healing well.    Plan     Patient to return in one week   This information has been scribed by Gaspar Cola CMA.   Keylah Darwish G 03/04/2016, 4:27 PM

## 2016-03-12 ENCOUNTER — Ambulatory Visit (INDEPENDENT_AMBULATORY_CARE_PROVIDER_SITE_OTHER): Payer: 59 | Admitting: General Surgery

## 2016-03-12 DIAGNOSIS — K429 Umbilical hernia without obstruction or gangrene: Secondary | ICD-10-CM

## 2016-03-12 NOTE — Patient Instructions (Addendum)
Return in one month.  

## 2016-03-12 NOTE — Progress Notes (Signed)
Patient ID: Francisco Gallagher, male   DOB: 28-Jan-1961, 55 y.o.   MRN: [id number]  Chief Complaint  Patient presents with  . Routine Post Op    ventral hernia    HPI Francisco Gallagher is a 55 y.o. male  here today for a follow up on an umbilical hernia repair done on 02/27/16. Patient states he is doing much better. I have reviewed the history of present illness with the patient.  HPI  Past Medical History:  Diagnosis Date  . Arthritis    knees  . Erectile dysfunction    Cialis PRN  . GERD (gastroesophageal reflux disease)   . Glucose intolerance (impaired glucose tolerance)   . Hyperlipidemia   . Hypertension   . Hypogonadism male 08/23/2011   s/p urology consult [facility]; no treatment indicated due to potential of pregnancy.  . Mild sleep apnea 2009   NEVER WAS GIVEN CPAP PER PT  . Multinodular goiter 07/23/2007   thyroid u/s: multinodular goiter; s/p ENT consult/[facility]:  Rx for Synthroid.  . Pain in joint, site unspecified   . Renal insufficiency    CYST ON KIDNEYS  . Thyroid disease    per pt low thyroid  . Tobacco use disorder   . Unspecified disorder of skin and subcutaneous tissue   . Unspecified hypothyroidism     Past Surgical History:  Procedure Laterality Date  . CARDIAC CATHETERIZATION  07/23/1999   negative.  . CHOLECYSTECTOMY  2003  . excision nosdule   L shouldher Left 2 2014  . INSERTION OF MESH N/A 02/27/2016   Procedure: INSERTION OF MESH;  Surgeon: Christene Lye, MD;  Location: [facility];  Service: General;  Laterality: N/A;  . pilonydal cyst  1981  . Sleep Study  07/23/2007  . VENTRAL HERNIA REPAIR N/A 02/27/2016   Procedure: HERNIA REPAIR VENTRAL ADULT;  Surgeon: Christene Lye, MD;  Location: [facility];  Service: General;  Laterality: N/A;    Family History  Problem Relation Age of Onset  . Arthritis Mother   . Hypertension Mother   . COPD Mother   . Depression Mother   . Fibromyalgia Mother   . Gout Mother   . Kidney disease Mother    . Cancer Mother     lung  . Heart disease Mother 9    AMI s/p stenting  . Hypertension Father   . Benign prostatic hyperplasia Father   . Rosacea Father   . Arthritis Father   . Arthritis Brother   . Benign prostatic hyperplasia Brother   . Heart disease Maternal Grandfather   . Hypertension Paternal Grandmother   . Stroke Paternal Grandfather   . Diabetes    . Colon cancer Neg Hx     Social History Social History  Substance Use Topics  . Smoking status: Never Smoker  . Smokeless tobacco: Current User    Types: Chew     Comment: PATIENT CHEWS TOBACCO  20 years  . Alcohol use Yes     Comment:  5 TIMES/YEAR - BEER AND LIQUOR    Allergies  Allergen Reactions  . Penicillins Rash    Has patient had a PCN reaction causing immediate rash, facial/tongue/throat swelling, SOB or lightheadedness with hypotension: rash Has patient had a PCN reaction causing severe rash involving mucus membranes or skin necrosis: no Has patient had a PCN reaction that required hospitalization no Has patient had a PCN reaction occurring within the last 10 years:no If all of the above answers are "  NO", then may proceed with Cephalosporin use.    Current Outpatient Prescriptions  Medication Sig Dispense Refill  . aspirin 81 MG tablet Take 81 mg by mouth as needed.     . diclofenac sodium (VOLTAREN) 1 % GEL Apply 2 Gallagher topically 4 (four) times daily. 100 Gallagher 5  . fenofibrate 160 MG tablet Take 1 tablet (160 mg total) by mouth daily. (Patient taking differently: Take 160 mg by mouth every evening. ) 90 tablet 3  . hydrochlorothiazide (HYDRODIURIL) 12.5 MG tablet Take 1 tablet (12.5 mg total) by mouth daily. 90 tablet 3  . irbesartan (AVAPRO) 300 MG tablet TAKE 1 TABLET (300 MG TOTAL) BY MOUTH DAILY. (Patient taking differently: Take 300 mg by mouth every morning. TAKE 1 TABLET (300 MG TOTAL) BY MOUTH DAILY.) 90 tablet 3  . levothyroxine (SYNTHROID, LEVOTHROID) 125 MCG tablet Take 1 tablet (125 mcg total) by  mouth daily before breakfast. 90 tablet 3  . Multiple Vitamin (MULTIVITAMIN WITH MINERALS) TABS tablet Take 1 tablet by mouth daily.    Marland Kitchen oxyCODONE-acetaminophen (ROXICET) 5-325 MG tablet Take 1 tablet by mouth every 4 (four) hours as needed. 30 tablet 0  . pantoprazole (PROTONIX) 40 MG tablet Take 1 tablet (40 mg total) by mouth daily. (Patient taking differently: Take 40 mg by mouth every other day. ) 90 tablet 3  . tadalafil (CIALIS) 20 MG tablet Take 1 tablet (20 mg total) by mouth daily as needed. 8 tablet 11   No current facility-administered medications for this visit.     Review of Systems Review of Systems  Constitutional: Negative.   Respiratory: Negative.   Cardiovascular: Negative.     There were no vitals taken for this visit.  Physical Exam Physical Exam  Constitutional: He is oriented to person, place, and time. He appears well-developed and well-nourished.  Abdominal: Soft. Normal appearance and bowel sounds are normal. There is no hepatomegaly. There is no tenderness.    Neurological: He is alert and oriented to person, place, and time.  Skin: Skin is warm and dry.    Data Reviewed  Prior notes. Assessment       Ventral hernia, repaired. Healing well.  Plan   May return to work next week.     Patient to return in one month.  Return to work on 03/18/16. This information has been scribed by Karie Fetch RN, BSN,BC.    Francisco Gallagher 03/14/2016, 9:14 AM

## 2016-03-14 ENCOUNTER — Encounter: Payer: Self-pay | Admitting: General Surgery

## 2016-03-15 ENCOUNTER — Encounter: Payer: Self-pay | Admitting: General Surgery

## 2016-03-29 ENCOUNTER — Encounter: Payer: Self-pay | Admitting: Family Medicine

## 2016-04-15 ENCOUNTER — Encounter: Payer: Self-pay | Admitting: General Surgery

## 2016-04-15 ENCOUNTER — Ambulatory Visit (INDEPENDENT_AMBULATORY_CARE_PROVIDER_SITE_OTHER): Payer: 59 | Admitting: General Surgery

## 2016-04-15 VITALS — BP 132/78 | HR 60 | Resp 12 | Ht 74.0 in | Wt 214.0 lb

## 2016-04-15 DIAGNOSIS — K429 Umbilical hernia without obstruction or gangrene: Secondary | ICD-10-CM

## 2016-04-15 NOTE — Progress Notes (Signed)
Patient ID: Francisco Gallagher, male   DOB: Jun 11, 1961, 55 y.o.   MRN: [id number]  Chief Complaint  Patient presents with  . Routine Post Op    umbiloixcal hernia    HPI Unknown Francisco Gallagher is a 55 y.o. male here today for a follow up on an umbilical hernia repair done on 02/27/16. Patient states he is doing well.   I have reviewed the history of present illness with the patient.  HPI  Past Medical History:  Diagnosis Date  . Arthritis    knees  . Erectile dysfunction    Cialis PRN  . GERD (gastroesophageal reflux disease)   . Glucose intolerance (impaired glucose tolerance)   . Hyperlipidemia   . Hypertension   . Hypogonadism male 08/23/2011   s/p urology consult [facility]; no treatment indicated due to potential of pregnancy.  . Mild sleep apnea 2009   NEVER WAS GIVEN CPAP PER PT  . Multinodular goiter 07/23/2007   thyroid u/s: multinodular goiter; s/p ENT consult/[facility]:  Rx for Synthroid.  . Pain in joint, site unspecified   . Renal insufficiency    CYST ON KIDNEYS  . Thyroid disease    per pt low thyroid  . Tobacco use disorder   . Unspecified disorder of skin and subcutaneous tissue   . Unspecified hypothyroidism     Past Surgical History:  Procedure Laterality Date  . CARDIAC CATHETERIZATION  07/23/1999   negative.  . CHOLECYSTECTOMY  2003  . excision nosdule   L shouldher Left 2 2014  . INSERTION OF MESH N/A 02/27/2016   Procedure: INSERTION OF MESH;  Surgeon: Christene Lye, MD;  Location: [facility];  Service: General;  Laterality: N/A;  . pilonydal cyst  1981  . Sleep Study  07/23/2007  . VENTRAL HERNIA REPAIR N/A 02/27/2016   Procedure: HERNIA REPAIR VENTRAL ADULT;  Surgeon: Christene Lye, MD;  Location: [facility];  Service: General;  Laterality: N/A;    Family History  Problem Relation Age of Onset  . Arthritis Mother   . Hypertension Mother   . COPD Mother   . Depression Mother   . Fibromyalgia Mother   . Gout Mother   . Kidney disease Mother    . Cancer Mother     lung  . Heart disease Mother 23    AMI s/p stenting  . Hypertension Father   . Benign prostatic hyperplasia Father   . Rosacea Father   . Arthritis Father   . Arthritis Brother   . Benign prostatic hyperplasia Brother   . Heart disease Maternal Grandfather   . Hypertension Paternal Grandmother   . Stroke Paternal Grandfather   . Diabetes    . Colon cancer Neg Hx     Social History Social History  Substance Use Topics  . Smoking status: Never Smoker  . Smokeless tobacco: Current User    Types: Chew     Comment: PATIENT CHEWS TOBACCO  20 years  . Alcohol use Yes     Comment:  5 TIMES/YEAR - BEER AND LIQUOR    Allergies  Allergen Reactions  . Penicillins Rash    Has patient had a PCN reaction causing immediate rash, facial/tongue/throat swelling, SOB or lightheadedness with hypotension: rash Has patient had a PCN reaction causing severe rash involving mucus membranes or skin necrosis: no Has patient had a PCN reaction that required hospitalization no Has patient had a PCN reaction occurring within the last 10 years:no If all of the above answers are "  NO", then may proceed with Cephalosporin use.    Current Outpatient Prescriptions  Medication Sig Dispense Refill  . aspirin 81 MG tablet Take 81 mg by mouth as needed.     . diclofenac sodium (VOLTAREN) 1 % GEL Apply 2 g topically 4 (four) times daily. 100 g 5  . fenofibrate 160 MG tablet Take 1 tablet (160 mg total) by mouth daily. (Patient taking differently: Take 160 mg by mouth every evening. ) 90 tablet 3  . hydrochlorothiazide (HYDRODIURIL) 12.5 MG tablet Take 1 tablet (12.5 mg total) by mouth daily. 90 tablet 3  . irbesartan (AVAPRO) 300 MG tablet TAKE 1 TABLET (300 MG TOTAL) BY MOUTH DAILY. (Patient taking differently: Take 300 mg by mouth every morning. TAKE 1 TABLET (300 MG TOTAL) BY MOUTH DAILY.) 90 tablet 3  . levothyroxine (SYNTHROID, LEVOTHROID) 125 MCG tablet Take 1 tablet (125 mcg total) by  mouth daily before breakfast. 90 tablet 3  . Multiple Vitamin (MULTIVITAMIN WITH MINERALS) TABS tablet Take 1 tablet by mouth daily.    . pantoprazole (PROTONIX) 40 MG tablet Take 1 tablet (40 mg total) by mouth daily. (Patient taking differently: Take 40 mg by mouth every other day. ) 90 tablet 3  . tadalafil (CIALIS) 20 MG tablet Take 1 tablet (20 mg total) by mouth daily as needed. 8 tablet 11   No current facility-administered medications for this visit.     Review of Systems Review of Systems  Constitutional: Negative.   Respiratory: Negative.   Cardiovascular: Negative.   Gastrointestinal: Negative.     Blood pressure 132/78, pulse 60, resp. rate 12, height 6\' 2"  (1.88 m), weight 214 lb (97.1 kg).  Physical Exam Physical Exam  Constitutional: He is oriented to person, place, and time. He appears well-developed and well-nourished.  Abdominal: Soft. Bowel sounds are normal. He exhibits no distension. There is no tenderness.    Umbilcal hernia repair is intact and healing well.   Neurological: He is alert and oriented to person, place, and time.  Skin: Skin is warm and dry.    Data Reviewed Prior notes  Assessment    Umbilical hernia, repaired    Plan    Return as needed.  This information has been scribed by Gaspar Cola CMA.       Teaghan Melrose G 04/15/2016, 3:32 PM

## 2016-04-15 NOTE — Patient Instructions (Addendum)
Return as needed

## 2016-06-25 ENCOUNTER — Ambulatory Visit (INDEPENDENT_AMBULATORY_CARE_PROVIDER_SITE_OTHER): Payer: 59 | Admitting: Family Medicine

## 2016-06-25 ENCOUNTER — Encounter: Payer: Self-pay | Admitting: Family Medicine

## 2016-06-25 VITALS — BP 130/66 | HR 80 | Temp 99.0°F | Resp 16 | Ht 72.5 in | Wt 223.0 lb

## 2016-06-25 DIAGNOSIS — M25561 Pain in right knee: Secondary | ICD-10-CM

## 2016-06-25 DIAGNOSIS — J0101 Acute recurrent maxillary sinusitis: Secondary | ICD-10-CM

## 2016-06-25 MED ORDER — AZITHROMYCIN 250 MG PO TABS: ORAL_TABLET | ORAL | 0 refills | Status: DC

## 2016-06-25 MED ORDER — PREDNISONE 20 MG PO TABS: ORAL_TABLET | ORAL | 0 refills | Status: DC

## 2016-06-25 MED ORDER — AZELASTINE HCL 0.1 % NA SOLN: 2.0000 | Freq: Two times a day (BID) | NASAL | 12 refills | Status: DC

## 2016-06-25 NOTE — Patient Instructions (Addendum)
IF you received an x-ray today, you will receive an invoice from [facility]. Please contact [facility] at [phone] with questions or concerns regarding your invoice.   IF you received labwork today, you will receive an invoice from [employer]. Please contact [facility] at [phone] with questions or concerns regarding your invoice.   Our billing staff will not be able to assist you with questions regarding bills from these companies.  You will be contacted with the lab results as soon as they are available. The fastest way to get your results is to activate your My Chart account. Instructions are located on the last Dett[place] of this paperwork. If you have not heard from [place] regarding the results in 2 weeks, please contact this office.     Sinusitis, Adult Sinusitis is soreness and inflammation of your sinuses. Sinuses are hollow spaces in the bones around your face. Your sinuses are located:  Around your eyes.  In the middle of your forehead.  Behind your nose.  In your cheekbones. Your sinuses and nasal passages are lined with a stringy fluid (mucus). Mucus normally drains out of your sinuses. When your nasal tissues become inflamed or swollen, the mucus can become trapped or blocked so air cannot flow through your sinuses. This allows bacteria, viruses, and funguses to grow, which leads to infection. Sinusitis can develop quickly and last for 7?10 days (acute) or for more than 12 weeks (chronic). Sinusitis often develops after a cold. What are the causes? This condition is caused by anything that creates swelling in the sinuses or stops mucus from draining, including:  Allergies.  Asthma.  Bacterial or viral infection.  Abnormally shaped bones between the nasal passages.  Nasal growths that contain mucus (nasal polyps).  Narrow sinus openings.  Pollutants, such as chemicals or irritants in the air.  A foreign object stuck  in the nose.  A fungal infection. This is rare. What increases the risk? The following factors may make you more likely to develop this condition:  Having allergies or asthma.  Having had a recent cold or respiratory tract infection.  Having structural deformities or blockages in your nose or sinuses.  Having a weak immune system.  Doing a lot of swimming or diving.  Overusing nasal sprays.  Smoking. What are the signs or symptoms? The main symptoms of this condition are pain and a feeling of pressure around the affected sinuses. Other symptoms include:  Upper toothache.  Earache.  Headache.  Bad breath.  Decreased sense of smell and taste.  A cough that may get worse at night.  Fatigue.  Fever.  Thick drainage from your nose. The drainage is often green and it may contain pus (purulent).  Stuffy nose or congestion.  Postnasal drip. This is when extra mucus collects in the throat or back of the nose.  Swelling and warmth over the affected sinuses.  Sore throat.  Sensitivity to light. How is this diagnosed? This condition is diagnosed based on symptoms, a medical history, and a physical exam. To find out if your condition is acute or chronic, your health care provider may:  Look in your nose for signs of nasal polyps.  Tap over the affected sinus to check for signs of infection.  View the inside of your sinuses using an imaging device that has a light attached (endoscope). If your health care provider suspects that you have chronic sinusitis, you may also:  Be tested for allergies.  Have a  sample of mucus taken from your nose (nasal culture) and checked for bacteria.  Have a mucus sample examined to see if your sinusitis is related to an allergy. If your sinusitis does not respond to treatment and it lasts longer than 8 weeks, you may have an MRI or CT scan to check your sinuses. These scans also help to determine how severe your infection is. In rare  cases, a bone biopsy may be done to rule out more serious types of fungal sinus disease. How is this treated? Treatment for sinusitis depends on the cause and whether your condition is chronic or acute. If a virus is causing your sinusitis, your symptoms will go away on their own within 10 days. You may be given medicines to relieve your symptoms, including:  Topical nasal decongestants. They shrink swollen nasal passages and let mucus drain from your sinuses.  Antihistamines. These drugs block inflammation that is triggered by allergies. This can help to ease swelling in your nose and sinuses.  Topical nasal corticosteroids. These are nasal sprays that ease inflammation and swelling in your nose and sinuses.  Nasal saline washes. These rinses can help to get rid of thick mucus in your nose. If your condition is caused by bacteria, you will be given an antibiotic medicine. If your condition is caused by a fungus, you will be given an antifungal medicine. Surgery may be needed to correct underlying conditions, such as narrow nasal passages. Surgery may also be needed to remove polyps. Follow these instructions at home: Medicines  Take, use, or apply over-the-counter and prescription medicines only as told by your health care provider. These may include nasal sprays.  If you were prescribed an antibiotic medicine, take it as told by your health care provider. Do not stop taking the antibiotic even if you start to feel better. Hydrate and Humidify  Drink enough water to keep your urine clear or pale yellow. Staying hydrated will help to thin your mucus.  Use a cool mist humidifier to keep the humidity level in your home above 50%.  Inhale steam for 10-15 minutes, 3-4 times a day or as told by your health care provider. You can do this in the bathroom while a hot shower is running.  Limit your exposure to cool or dry air. Rest  Rest as much as possible.  Sleep with your head raised  (elevated).  Make sure to get enough sleep each night. General instructions  Apply a warm, moist washcloth to your face 3-4 times a day or as told by your health care provider. This will help with discomfort.  Wash your hands often with soap and water to reduce your exposure to viruses and other germs. If soap and water are not available, use hand sanitizer.  Do not smoke. Avoid being around people who are smoking (secondhand smoke).  Keep all follow-up visits as told by your health care provider. This is important. Contact a health care provider if:  You have a fever.  Your symptoms get worse.  Your symptoms do not improve within 10 days. Get help right away if:  You have a severe headache.  You have persistent vomiting.  You have pain or swelling around your face or eyes.  You have vision problems.  You develop confusion.  Your neck is stiff.  You have trouble breathing. This information is not intended to replace advice given to you by your health care provider. Make sure you discuss any questions you have with your health  care provider. Document Released: 07/08/2005 Document Revised: 03/03/2016 Document Reviewed: 05/03/2015 Elsevier Interactive Patient Education  2017 [employer].

## 2016-06-25 NOTE — Progress Notes (Signed)
Subjective:    Patient ID: Francisco Gallagher, male    DOB: 04/03/1961, 55 y.o.   MRN: [id number]  06/25/2016  Head cold (x 1 month); Cough; and swelling in right knee (x1 1/2 months)   HPI This 55 y.o. male presents for evaluation cold symptoms for one month.  No fever/chills/sweats.  Coughing; barking like a dog; sputum production; blowing nose; pressure; eyes burning. Alkeseltzer Plus. Went [facility]; prescribed Doxycycline without improvement.  Has met deductible. Using Afrin.    R knee pain: by lunchtime, knee stiffens up at lunch; then limps the remainder of the day.  When gets home, elevates knee.  Swells minimally.    Review of Systems  Constitutional: Negative for activity change, appetite change, chills, diaphoresis, fatigue and fever.  HENT: Positive for congestion, postnasal drip, rhinorrhea, sinus pain and sinus pressure. Negative for sore throat.   Eyes: Negative for visual disturbance.  Respiratory: Positive for cough. Negative for shortness of breath.   Cardiovascular: Negative for chest pain, palpitations and leg swelling.  Endocrine: Negative for cold intolerance, heat intolerance, polydipsia, polyphagia and polyuria.  Musculoskeletal: Positive for arthralgias, gait problem and joint swelling.  Neurological: Positive for headaches. Negative for dizziness, tremors, seizures, syncope, facial asymmetry, speech difficulty, weakness, light-headedness and numbness.    Past Medical History:  Diagnosis Date  . Arthritis    knees  . Erectile dysfunction    Cialis PRN  . GERD (gastroesophageal reflux disease)   . Glucose intolerance (impaired glucose tolerance)   . Hyperlipidemia   . Hypertension   . Hypogonadism male 08/23/2011   s/p urology consult [facility]; no treatment indicated due to potential of pregnancy.  . Mild sleep apnea 2009   NEVER WAS GIVEN CPAP PER PT  . Multinodular goiter 07/23/2007   thyroid u/s: multinodular goiter; s/p  ENT consult/[facility]:  Rx for Synthroid.  . Pain in joint, site unspecified   . Renal insufficiency    CYST ON KIDNEYS  . Thyroid disease    per pt low thyroid  . Tobacco use disorder   . Unspecified disorder of skin and subcutaneous tissue   . Unspecified hypothyroidism    Past Surgical History:  Procedure Laterality Date  . CARDIAC CATHETERIZATION  07/23/1999   negative.  . CHOLECYSTECTOMY  2003  . excision nosdule   L shouldher Left 2 2014  . INSERTION OF MESH N/A 02/27/2016   Procedure: INSERTION OF MESH;  Surgeon: Christene Lye, MD;  Location: [facility] ORS;  Service: General;  Laterality: N/A;  . pilonydal cyst  1981  . Sleep Study  07/23/2007  . VENTRAL HERNIA REPAIR N/A 02/27/2016   Procedure: HERNIA REPAIR VENTRAL ADULT;  Surgeon: Christene Lye, MD;  Location: [facility] ORS;  Service: General;  Laterality: N/A;   Allergies  Allergen Reactions  . Penicillins Rash    Has patient had a PCN reaction causing immediate rash, facial/tongue/throat swelling, SOB or lightheadedness with hypotension: rash Has patient had a PCN reaction causing severe rash involving mucus membranes or skin necrosis: no Has patient had a PCN reaction that required hospitalization no Has patient had a PCN reaction occurring within the last 10 years:no If all of the above answers are "NO", then may proceed with Cephalosporin use.    Social History   Social History  . Marital status: Single    Spouse name: N/A  . Number of children: 0  . Years of education: N/A   Occupational History  .  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 Smoking status: Never Smoker  . Smokeless tobacco: Current User    Types: Chew     Comment: PATIENT CHEWS TOBACCO  20 years  . Alcohol use Yes     Comment:  5 TIMES/YEAR - BEER AND LIQUOR  . Drug use: No  . Sexual activity: Yes   Other Topics Concern  . Not on file   Social History Narrative   Marital status: single;  girlfriend passed in 02/2013 of breast cancer age 56.  Not dating in 2017.      Children: none      Lives: with father; mother passed away 11/19/2013.      Employment:  Works at [employer] in [place] x 28 years; happy      Tobacco:  Chews tobacco x 26 years      Alcohol:  5 times per year at [employer]      Drugs:  None      Exercise: sporadic; bicycle stationary sporadically      Seatbelt:  50% of time      Guns: loaded secured guns in home.       Sexual activity: sexually active; no STDs; total sexual partners < 10.        Smoke alarm and carbon monoxide detector in the home.      Caffeine use: carbonated beverages, moderate amount.   Family History  Problem Relation Age of Onset  . Arthritis Mother   . Hypertension Mother   . COPD Mother   . Depression Mother   . Fibromyalgia Mother   . Gout Mother   . Kidney disease Mother   . Cancer Mother     lung  . Heart disease Mother 16    AMI s/p stenting  . Hypertension Father   . Benign prostatic hyperplasia Father   . Rosacea Father   . Arthritis Father   . Arthritis Brother   . Benign prostatic hyperplasia Brother   . Heart disease Maternal Grandfather   . Hypertension Paternal Grandmother   . Stroke Paternal Grandfather   . Diabetes    . Colon cancer Neg Hx        Objective:    BP 130/66   Pulse 80   Temp 99 F (37.2 C) (Oral)   Resp 16   Ht 6' 0.5" (1.842 m)   Wt 223 lb (101.2 kg)   SpO2 97%   BMI 29.83 kg/m  Physical Exam  Constitutional: He is oriented to person, place, and time. He appears well-developed and well-nourished. No distress.  HENT:  Head: Normocephalic and atraumatic.  Right Ear: Tympanic membrane, external ear and ear canal normal.  Left Ear: Tympanic membrane, external ear and ear canal normal.  Nose: Mucosal edema and rhinorrhea present. Right sinus exhibits maxillary sinus tenderness and frontal sinus tenderness. Left sinus exhibits maxillary sinus tenderness and frontal sinus  tenderness.  Mouth/Throat: Oropharynx is clear and moist.  Eyes: Conjunctivae and EOM are normal. Pupils are equal, round, and reactive to light.  Neck: Normal range of motion. Neck supple. Carotid bruit is not present. No thyromegaly present.  Cardiovascular: Normal rate, regular rhythm, normal heart sounds and intact distal pulses.  Exam reveals no gallop and no friction rub.   No murmur heard. Pulmonary/Chest: Effort normal and breath sounds normal. He has no wheezes. He has no rales.  Abdominal: Soft. Bowel  sounds are normal. He exhibits no distension and no mass. There is no tenderness. There is no rebound and no guarding.  Musculoskeletal:       Right knee: He exhibits no swelling, no effusion and no MCL laxity. Tenderness found. Lateral joint line tenderness noted. No medial joint line tenderness noted.  Lymphadenopathy:    He has no cervical adenopathy.  Neurological: He is alert and oriented to person, place, and time. No cranial nerve deficit.  Skin: Skin is warm and dry. No rash noted. He is not diaphoretic.  Psychiatric: He has a normal mood and affect. His behavior is normal.  Nursing note and vitals reviewed.       Assessment & Plan:   1. Acute recurrent maxillary sinusitis   2. Acute pain of right knee    -new acute sinusitis; s/p Doxy; rx Zithromax, Prednisone,Astelin. -refer to ortho due to persistent R knee pain.   Orders Placed This Encounter  Procedures  . Ambulatory referral to Orthopedic Surgery    Referral Priority:   Routine    Referral Type:   Surgical    Referral Reason:   Specialty Services Required    Requested Specialty:   Orthopedic Surgery    Number of Visits Requested:   1   Meds ordered this encounter  Medications  . azithromycin (ZITHROMAX) 250 MG tablet    Sig: Two daily x 1 day then one daily x 4 days    Dispense:  6 tablet    Refill:  0  . predniSONE (DELTASONE) 20 MG tablet    Sig: Take 3 PO QAM x 2 days, 2 PO QAM x 5 days, 1 PO QAM x  5 days    Dispense:  21 tablet    Refill:  0  . azelastine (ASTELIN) 0.1 % nasal spray    Sig: Place 2 sprays into both nostrils 2 (two) times daily. Use in each nostril as directed    Dispense:  30 mL    Refill:  12    No Follow-up on file.   Dquan Cortopassi Elayne Guerin, M.D. [facility] [address] [place], [place]  [phone] phone [phone] fax

## 2016-07-08 ENCOUNTER — Ambulatory Visit (INDEPENDENT_AMBULATORY_CARE_PROVIDER_SITE_OTHER): Payer: 59

## 2016-07-08 ENCOUNTER — Ambulatory Visit (INDEPENDENT_AMBULATORY_CARE_PROVIDER_SITE_OTHER): Payer: 59 | Admitting: Orthopaedic Surgery

## 2016-07-08 ENCOUNTER — Encounter (INDEPENDENT_AMBULATORY_CARE_PROVIDER_SITE_OTHER): Payer: Self-pay | Admitting: Orthopaedic Surgery

## 2016-07-08 DIAGNOSIS — M1711 Unilateral primary osteoarthritis, right knee: Secondary | ICD-10-CM

## 2016-07-08 NOTE — Progress Notes (Signed)
Office Visit Note   Patient: Francisco Gallagher           Date of Birth: [birth year]           MRN: [id number] Visit Date: 07/08/2016              Requested by: Wardell Honour, MD [address] [place], [place] [address] PCP: Reginia Forts, MD   Assessment & Plan: Visit Diagnoses:  1. Primary osteoarthritis of right knee     Plan: Impression is moderate osteoarthritis right knee. Voltaren gel prescription provided today information amount of this provided. Knee injection was performed today with cortisone. Patient cannot take NSAIDs due to renal cysts. Follow-up as needed.  Follow-Up Instructions: No Follow-up on file.   Orders:  Orders Placed This Encounter  Procedures  . Large Joint Injection/Arthrocentesis  . XR KNEE 3 VIEW RIGHT   No orders of the defined types were placed in this encounter.     Procedures: Large Joint Inj Date/Time: 07/08/2016 5:15 PM Performed by: Leandrew Koyanagi Authorized by: Leandrew Koyanagi   Consent Given by:  Patient Timeout: prior to procedure the correct patient, procedure, and site was verified   Indications:  Pain Location:  Knee Site:  R knee Prep: patient was prepped and draped in usual sterile fashion   Needle Size:  22 G Ultrasound Guidance: No   Fluoroscopic Guidance: No   Arthrogram: No   Patient tolerance:  Patient tolerated the procedure well with no immediate complications     Clinical Data: No additional findings.   Subjective: Chief Complaint  Patient presents with  . Right Knee - Pain    Patient is a 55 year old gentleman with right knee pain for about 6 months with recent worsening. He has taken prednisone in the past with partial and temporary relief. He endorses pain throughout his knee does not radiate and throbs. Pain is worse with activity and flexion of the knee. Denies any injuries.    Review of Systems  Constitutional: Negative.   All other systems reviewed and are negative.    Objective: Vital Signs:  There were no vitals taken for this visit.  Physical Exam  Constitutional: He is oriented to person, place, and time. He appears well-developed and well-nourished.  HENT:  Head: Normocephalic and atraumatic.  Eyes: EOM are normal.  Neck: Neck supple.  Cardiovascular: Intact distal pulses.   Pulmonary/Chest: Effort normal.  Abdominal: Soft.  Neurological: He is alert and oriented to person, place, and time.  Psychiatric: He has a normal mood and affect. His behavior is normal. Judgment and thought content normal.  Nursing note and vitals reviewed.   Ortho Exam Exam of the right knee shows no joint effusion. He has adequate range of motion. Collaterals and cruciates are stable.  Specialty Comments:  No specialty comments available.  Imaging: Xr Knee 3 View Right  Result Date: 07/08/2016 Moderate tricompartmental osteoarthritis    PMFS History: Patient Active Problem List   Diagnosis Date Noted  . Primary osteoarthritis of right knee 07/08/2016  . Renal insufficiency 02/06/2015  . Dyspnea on exertion 08/23/2014  . Glucose intolerance (impaired glucose tolerance) 02/16/2014  . Problems related to high-risk sexual behavior 08/16/2013  . Colon cancer screening 08/16/2013  . Prostate cancer screening 08/16/2013  . Routine general medical examination at a health care facility 08/03/2012  . Essential hypertension, benign 08/03/2012  . Stress reaction 08/03/2012  . Hyperlipidemia 08/03/2012  . Hypothyroidism 08/03/2012  . GERD (gastroesophageal reflux disease) 08/03/2012  .  Erectile dysfunction 08/03/2012  . Hypogonadism male 08/03/2012   Past Medical History:  Diagnosis Date  . Arthritis    knees  . Erectile dysfunction    Cialis PRN  . GERD (gastroesophageal reflux disease)   . Glucose intolerance (impaired glucose tolerance)   . Hyperlipidemia   . Hypertension   . Hypogonadism male 08/23/2011   s/p urology consult [facility]; no treatment indicated due to  potential of pregnancy.  . Mild sleep apnea 2009   NEVER WAS GIVEN CPAP PER PT  . Multinodular goiter 07/23/2007   thyroid u/s: multinodular goiter; s/p ENT consult/[facility]:  Rx for Synthroid.  . Pain in joint, site unspecified   . Renal insufficiency    CYST ON KIDNEYS  . Thyroid disease    per pt low thyroid  . Tobacco use disorder   . Unspecified disorder of skin and subcutaneous tissue   . Unspecified hypothyroidism     Family History  Problem Relation Age of Onset  . Arthritis Mother   . Hypertension Mother   . COPD Mother   . Depression Mother   . Fibromyalgia Mother   . Gout Mother   . Kidney disease Mother   . Cancer Mother     lung  . Heart disease Mother 74    AMI s/p stenting  . Hypertension Father   . Benign prostatic hyperplasia Father   . Rosacea Father   . Arthritis Father   . Arthritis Brother   . Benign prostatic hyperplasia Brother   . Heart disease Maternal Grandfather   . Hypertension Paternal Grandmother   . Stroke Paternal Grandfather   . Diabetes    . Colon cancer Neg Hx     Past Surgical History:  Procedure Laterality Date  . CARDIAC CATHETERIZATION  07/23/1999   negative.  . CHOLECYSTECTOMY  2003  . excision nosdule   L shouldher Left 2 2014  . INSERTION OF MESH N/A 02/27/2016   Procedure: INSERTION OF MESH;  Surgeon: Christene Lye, MD;  Location: [facility] OR[facility];  Service: General;  Laterality: N/A;  . pilonydal cyst  1981  . Sleep Study  07/23/2007  . VENTRAL HERNIA REPAIR N/A 02/27/2016   Procedure: HERNIA REPAIR VENTRAL ADULT;  Surgeon: Christene Lye, MD;  Location: [facility] ORS;  Service: General;  Laterality: N/A;   Social History   Occupational History  . 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 Smoking status: Never Smoker  . Smokeless tobacco: Current User    Types: Chew     Comment: PATIENT CHEWS TOBACCO  20 years  . Alcohol use Yes     Comment:  5 TIMES/YEAR - BEER AND LIQUOR  . Drug  use: No  . Sexual activity: Yes

## 2016-07-23 ENCOUNTER — Ambulatory Visit (INDEPENDENT_AMBULATORY_CARE_PROVIDER_SITE_OTHER): Payer: 59 | Admitting: Family Medicine

## 2016-07-23 ENCOUNTER — Encounter: Payer: Self-pay | Admitting: Family Medicine

## 2016-07-23 ENCOUNTER — Ambulatory Visit (INDEPENDENT_AMBULATORY_CARE_PROVIDER_SITE_OTHER): Payer: 59

## 2016-07-23 VITALS — BP 120/86 | HR 63 | Temp 97.8°F | Resp 16 | Ht 72.5 in | Wt 215.6 lb

## 2016-07-23 DIAGNOSIS — M545 Low back pain, unspecified: Secondary | ICD-10-CM

## 2016-07-23 DIAGNOSIS — N289 Disorder of kidney and ureter, unspecified: Secondary | ICD-10-CM | POA: Diagnosis not present

## 2016-07-23 MED ORDER — METHOCARBAMOL 500 MG PO TABS: 500.0000 mg | ORAL_TABLET | Freq: Four times a day (QID) | ORAL | 0 refills | Status: DC | PRN

## 2016-07-23 NOTE — Patient Instructions (Addendum)
   IF you received an x-ray today, you will receive an invoice from [facility]. Please contact [facility] at [phone] with questions or concerns regarding your invoice.   IF you received labwork today, you will receive an invoice from [facility]. Please contact [facility] at [phone] with questions or concerns regarding your invoice.   Our billing staff will not be able to assist you with questions regarding bills from these companies.  You will be contacted with the lab results as soon as they are available. The fastest way to get your results is to activate your My Chart account. Instructions are located on the last Wurzel of this paperwork. If you have not heard from us regarding the results in 2 weeks, please contact this office.     Low Back Sprain Rehab Ask your health care provider which exercises are safe for you. Do exercises exactly as told by your health care provider and adjust them as directed. It is normal to feel mild stretching, pulling, tightness, or discomfort as you do these exercises, but you should stop right away if you feel sudden pain or your pain gets worse. Do not begin these exercises until told by your health care provider. Stretching and range of motion exercises These exercises warm up your muscles and joints and improve the movement and flexibility of your back. These exercises also help to relieve pain, numbness, and tingling. Exercise A: Lumbar rotation   1. Lie on your back on a firm surface and bend your knees. 2. Straighten your arms out to your sides so each arm forms an "L" shape with a side of your body (a 90 degree angle). 3. Slowly move both of your knees to one side of your body until you feel a stretch in your lower back. Try not to let your shoulders move off of the floor. 4. Hold for __________ seconds. 5. Tense your abdominal muscles and slowly move your knees back to the starting position. 6. Repeat this exercise on the  other side of your body. Repeat __________ times. Complete this exercise __________ times a day. Exercise B: Prone extension on elbows   1. Lie on your abdomen on a firm surface. 2. Prop yourself up on your elbows. 3. Use your arms to help lift your chest up until you feel a gentle stretch in your abdomen and your lower back.  This will place some of your body weight on your elbows. If this is uncomfortable, try stacking pillows under your chest.  Your hips should stay down, against the surface that you are lying on. Keep your hip and back muscles relaxed. 4. Hold for __________ seconds. 5. Slowly relax your upper body and return to the starting position. Repeat __________ times. Complete this exercise __________ times a day. Strengthening exercises These exercises build strength and endurance in your back. Endurance is the ability to use your muscles for a long time, even after they get tired. Exercise C: Pelvic tilt  1. Lie on your back on a firm surface. Bend your knees and keep your feet flat. 2. Tense your abdominal muscles. Tip your pelvis up toward the ceiling and flatten your lower back into the floor.  To help with this exercise, you may place a small towel under your lower back and try to push your back into the towel. 3. Hold for __________ seconds. 4. Let your muscles relax completely before you repeat this exercise. Repeat __________ times. Complete this exercise __________ times a day. Exercise   D: Alternating arm and leg raises   1. Get on your hands and knees on a firm surface. If you are on a hard floor, you may want to use padding to cushion your knees, such as an exercise mat. 2. Line up your arms and legs. Your hands should be below your shoulders, and your knees should be below your hips. 3. Lift your left leg behind you. At the same time, raise your right arm and straighten it in front of you.  Do not lift your leg higher than your hip.  Do not lift your arm  higher than your shoulder.  Keep your abdominal and back muscles tight.  Keep your hips facing the ground.  Do not arch your back.  Keep your balance carefully, and do not hold your breath. 4. Hold for __________ seconds. 5. Slowly return to the starting position and repeat with your right leg and your left arm. Repeat __________ times. Complete this exercise __________ times a day. Exercise E: Abdominal set with straight leg raise   1. Lie on your back on a firm surface. 2. Bend one of your knees and keep your other leg straight. 3. Tense your abdominal muscles and lift your straight leg up, 4-6 inches (10-15 cm) off the ground. 4. Keep your abdominal muscles tight and hold for __________ seconds.  Do not hold your breath.  Do not arch your back. Keep it flat against the ground. 5. Keep your abdominal muscles tense as you slowly lower your leg back to the starting position. 6. Repeat with your other leg. Repeat __________ times. Complete this exercise __________ times a day. Posture and body mechanics   Body mechanics refers to the movements and positions of your body while you do your daily activities. Posture is part of body mechanics. Good posture and healthy body mechanics can help to relieve stress in your body's tissues and joints. Good posture means that your spine is in its natural S-curve position (your spine is neutral), your shoulders are pulled back slightly, and your head is not tipped forward. The following are general guidelines for applying improved posture and body mechanics to your everyday activities. Standing    When standing, keep your spine neutral and your feet about hip-width apart. Keep a slight bend in your knees. Your ears, shoulders, and hips should line up.  When you do a task in which you stand in one place for a long time, place one foot up on a stable object that is 2-4 inches (5-10 cm) high, such as a footstool. This helps keep your spine  neutral. Sitting    When sitting, keep your spine neutral and keep your feet flat on the floor. Use a footrest, if necessary, and keep your thighs parallel to the floor. Avoid rounding your shoulders, and avoid tilting your head forward.  When working at a desk or a computer, keep your desk at a height where your hands are slightly lower than your elbows. Slide your chair under your desk so you are close enough to maintain good posture.  When working at a computer, place your monitor at a height where you are looking straight ahead and you do not have to tilt your head forward or downward to look at the screen. Resting    When lying down and resting, avoid positions that are most painful for you.  If you have pain with activities such as sitting, bending, stooping, or squatting (flexion-based activities), lie in a position in which   your body does not bend very much. For example, avoid curling up on your side with your arms and knees near your chest (fetal position).  If you have pain with activities such as standing for a long time or reaching with your arms (extension-based activities), lie with your spine in a neutral position and bend your knees slightly. Try the following positions:  Lying on your side with a pillow between your knees.  Lying on your back with a pillow under your knees. Lifting    When lifting objects, keep your feet at least shoulder-width apart and tighten your abdominal muscles.  Bend your knees and hips and keep your spine neutral. It is important to lift using the strength of your legs, not your back. Do not lock your knees straight out.  Always ask for help to lift heavy or awkward objects. This information is not intended to replace advice given to you by your health care provider. Make sure you discuss any questions you have with your health care provider. Document Released: 07/08/2005 Document Revised: 03/14/2016 Document Reviewed: 04/19/2015 Elsevier  Interactive Patient Education  2017 Elsevier Inc.  

## 2016-07-23 NOTE — Progress Notes (Signed)
Subjective:    Patient ID: Francisco Gallagher, male    DOB: [birth year], 56 y.o.   MRN: [id number]  07/23/2016  Back Pain (mid, started this am, "bent over this am and it popped")   HPI This 56 y.o. male presents for evaluation of low back pain.  Sitting on a stool and zipping up pants and felt a pop in R middle back. Started hurting in lower back and to B hips.  Stiffness and painful.  No radiation into legs.  Normal  b/b function.  No saddle paresthesias.  Took tylenol and took Ibuprofen two total at 10:00am.  Laid on heating pad.  Better but not sure what to do.  Severity 7/10 with onset; now 4/10.  A lot of stiffness.   Immunization History  Administered Date(s) Administered  . Tdap 01/18/2008    Review of Systems  Constitutional: Negative for chills, diaphoresis, fatigue and fever.  Gastrointestinal: Negative for abdominal pain, anal bleeding, blood in stool, constipation, diarrhea, nausea and rectal pain.  Genitourinary: Negative for decreased urine volume, difficulty urinating, discharge, dysuria, enuresis, frequency, genital sores, hematuria, penile pain, penile swelling, scrotal swelling, testicular pain and urgency.  Musculoskeletal: Positive for back pain and myalgias.  Skin: Negative for rash.  Neurological: Negative for weakness and numbness.    Past Medical History:  Diagnosis Date  . Arthritis    knees  . Erectile dysfunction    Cialis PRN  . GERD (gastroesophageal reflux disease)   . Glucose intolerance (impaired glucose tolerance)   . Hyperlipidemia   . Hypertension   . Hypogonadism male 08/23/2011   s/p urology consult [facility]; no treatment indicated due to potential of pregnancy.  . Mild sleep apnea 2009   NEVER WAS GIVEN CPAP PER PT  . Multinodular goiter 07/23/2007   thyroid u/s: multinodular goiter; s/p ENT consult/[facility]:  Rx for Synthroid.  . Pain in joint, site unspecified   . Renal insufficiency    CYST ON KIDNEYS  . Thyroid disease    per pt  low thyroid  . Tobacco use disorder   . Unspecified disorder of skin and subcutaneous tissue   . Unspecified hypothyroidism    Past Surgical History:  Procedure Laterality Date  . CARDIAC CATHETERIZATION  07/23/1999   negative.  . CHOLECYSTECTOMY  2003  . excision nosdule   L shouldher Left 2 2014  . INSERTION OF MESH N/A 02/27/2016   Procedure: INSERTION OF MESH;  Surgeon: Christene Lye, MD;  Location: [facility] ORS;  Service: General;  Laterality: N/A;  . pilonydal cyst  1981  . Sleep Study  07/23/2007  . VENTRAL HERNIA REPAIR N/A 02/27/2016   Procedure: HERNIA REPAIR VENTRAL ADULT;  Surgeon: Christene Lye, MD;  Location: [facility] ORS;  Service: General;  Laterality: N/A;   Allergies  Allergen Reactions  . Penicillins Rash    Has patient had a PCN reaction causing immediate rash, facial/tongue/throat swelling, SOB or lightheadedness with hypotension: rash Has patient had a PCN reaction causing severe rash involving mucus membranes or skin necrosis: no Has patient had a PCN reaction that required hospitalization no Has patient had a PCN reaction occurring within the last 10 years:no If all of the above answers are "NO", then may proceed with Cephalosporin use.   Current Outpatient Prescriptions  Medication Sig Dispense Refill  . Multiple Vitamin (MULTIVITAMIN WITH MINERALS) TABS tablet Take 1 tablet by mouth daily.    Marland Kitchen aspirin 81 MG tablet Take 81 mg by mouth as needed.     Marland Kitchen  fenofibrate 160 MG tablet Take 1 tablet (160 mg total) by mouth daily. 90 tablet 3  . hydrochlorothiazide (HYDRODIURIL) 12.5 MG tablet Take 1 tablet (12.5 mg total) by mouth daily. 90 tablet 3  . irbesartan (AVAPRO) 300 MG tablet TAKE 1 TABLET (300 MG TOTAL) BY MOUTH DAILY. 90 tablet 3  . levothyroxine (SYNTHROID, LEVOTHROID) 125 MCG tablet Take 1 tablet (125 mcg total) by mouth daily before breakfast. 90 tablet 3  . methocarbamol (ROBAXIN) 500 MG tablet Take 1 tablet (500 mg total) by mouth every 6 (six)  hours as needed for muscle spasms. 45 tablet 0  . pantoprazole (PROTONIX) 40 MG tablet Take 1 tablet (40 mg total) by mouth daily. 90 tablet 3  . tadalafil (CIALIS) 20 MG tablet Take 1 tablet (20 mg total) by mouth daily as needed. 8 tablet 11   No current facility-administered medications for this visit.    Social History   Social History  . Marital status: Single    Spouse name: N/A  . Number of children: 0  . Years of education: N/A   Occupational History  . 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 Smoking status: Never Smoker  . Smokeless tobacco: Current User    Types: Chew     Comment: PATIENT CHEWS TOBACCO  20 years  . Alcohol use Yes     Comment:  5 TIMES/YEAR - [employer]  . Drug use: No  . Sexual activity: Yes   Other Topics Concern  . Not on file   Social History Narrative   Marital status: single; girlfriend passed in 02/2013 of breast cancer age 32.  Not dating in 2018.      Children: none      Lives: with father; mother passed away Nov 29, 2013.      Employment:  Works at [employer] in [place] x 29 years; happy      Tobacco:  Chews tobacco x 27 years      Alcohol:  5 times per year at [employer]      Drugs:  None      Exercise: sporadic; bicycle stationary sporadically in 2018      Seatbelt:  50% of time; no texting      Guns: loaded secured guns in home.       Sexual activity: sexually active; no STDs; total sexual partners < 10.        Smoke alarm and carbon monoxide detector in the home.      Caffeine use: carbonated beverages, moderate amount.   Family History  Problem Relation Age of Onset  . Arthritis Mother   . Hypertension Mother   . COPD Mother   . Depression Mother   . Fibromyalgia Mother   . Gout Mother   . Kidney disease Mother   . Cancer Mother     lung  . Heart disease Mother 35    AMI s/p stenting  . Hypertension Father   . Benign prostatic hyperplasia Father   . Rosacea Father   .  Arthritis Father   . Arthritis Brother   . Benign prostatic hyperplasia Brother   . Heart disease Maternal Grandfather   . Hypertension Paternal Grandmother   . Stroke Paternal Grandfather   . Diabetes    . Colon cancer Neg Hx        Objective:    BP 120/86   Pulse 63  Temp 97.8 F (36.6 C) (Oral)   Resp 16   Ht 6' 0.5" (1.842 m)   Wt 215 lb 9.6 oz (97.8 kg)   SpO2 98%   BMI 28.84 kg/m  Physical Exam  Constitutional: He is oriented to person, place, and time. He appears well-developed and well-nourished. No distress.  HENT:  Head: Normocephalic and atraumatic.  Eyes: Conjunctivae and EOM are normal. Pupils are equal, round, and reactive to light.  Neck: Normal range of motion. Neck supple. Carotid bruit is not present. No thyromegaly present.  Cardiovascular: Normal rate, regular rhythm, normal heart sounds and intact distal pulses.  Exam reveals no gallop and no friction rub.   No murmur heard. Pulmonary/Chest: Effort normal and breath sounds normal. He has no wheezes. He has no rales.  Musculoskeletal:       Lumbar back: He exhibits decreased range of motion, tenderness and pain. He exhibits no spasm and normal pulse.  Lumbar spine:  Non-tender midline; non-tender paraspinal regions B.  Straight leg raises negative B; toe and heel walking intact; marching intact; motor 5/5 BLE.  Decreased ROM lumbar spine due to pain.   Lymphadenopathy:    He has no cervical adenopathy.  Neurological: He is alert and oriented to person, place, and time. He has normal strength. No cranial nerve deficit or sensory deficit.  Skin: Skin is warm and dry. No rash noted. He is not diaphoretic.  Psychiatric: He has a normal mood and affect. His behavior is normal.  Nursing note and vitals reviewed.       Assessment & Plan:   1. Acute midline low back pain without sciatica   2. Renal insufficiency    -New. -Obtain lumbar spine films -recommend rest, icing for the first 48-72 hours and  then transition to heat. -rx for Robaxin provided; recommend Tylenol for pain.  Avoid NSAIDs due to renal insufficiency. -recommend avoiding repetitive bending/twisting/rotating; avoid lifting > 10 pounds for the next two weeks. -home exercise program provided. -call in 2 weeks if no improvement.   Orders Placed This Encounter  Procedures  . DG Lumbar Spine Complete    Standing Status:   Future    Number of Occurrences:   1    Standing Expiration Date:   07/23/2017    Order Specific Question:   Reason for Exam (SYMPTOM  OR DIAGNOSIS REQUIRED)    Answer:   bent over to tie shoes with pop and sudden onset of midline back pain; no radicular symptoms.    Order Specific Question:   Preferred imaging location?    Answer:   External   Meds ordered this encounter  Medications  . methocarbamol (ROBAXIN) 500 MG tablet    Sig: Take 1 tablet (500 mg total) by mouth every 6 (six) hours as needed for muscle spasms.    Dispense:  45 tablet    Refill:  0    Return if symptoms worsen or fail to improve.   Lexys Milliner Elayne Guerin, M.D. [facility] [address] [place], [place]  [address] [phone] phone [phone] fax

## 2016-07-24 ENCOUNTER — Telehealth: Payer: Self-pay | Admitting: Emergency Medicine

## 2016-07-24 NOTE — Telephone Encounter (Signed)
-----   Message from Wardell Honour, MD sent at 07/23/2016 10:08 PM EST ----- Low back xrays show:  Mild degenerative changes present in lumbar spine; scoliosis is stable and unchanged.  Recommend rest, icing, Tylenol and Ibuprofen and muscle relaxer.

## 2016-07-24 NOTE — Telephone Encounter (Signed)
PATIENT STATES HE SAW DR. Tamala Julian YESTERDAY FOR BACK PAIN. JILL CALLED HIM TODAY WITH THE RESULTS OF HIS X-RAY. HE HAS SOME MORE QUESTIONS. DOES HE HAVE A SLIPPED DISC OR A PULLED MUSCLE? BEST PHONE [phone] (HOME) [place]

## 2016-07-25 NOTE — Telephone Encounter (Signed)
Can you review specifics and advise the answer to his question.

## 2016-07-26 NOTE — Telephone Encounter (Signed)
Call -- patient does NOT have a slipped disc.  He has a low back sprain/strain or pulled muscle.  Continue with treatment plan as discussed at visit.

## 2016-07-26 NOTE — Telephone Encounter (Signed)
LMOV informing pt that he has a low back sprain/strain or possible pulled muscle, and to continue with treatment plan discussed at visit.

## 2016-08-03 ENCOUNTER — Other Ambulatory Visit: Payer: Self-pay | Admitting: Family Medicine

## 2016-08-03 DIAGNOSIS — I10 Essential (primary) hypertension: Secondary | ICD-10-CM

## 2016-08-06 ENCOUNTER — Ambulatory Visit (INDEPENDENT_AMBULATORY_CARE_PROVIDER_SITE_OTHER): Payer: 59 | Admitting: Family Medicine

## 2016-08-06 ENCOUNTER — Encounter: Payer: Self-pay | Admitting: Family Medicine

## 2016-08-06 VITALS — BP 126/80 | HR 79 | Temp 98.0°F | Resp 16 | Ht 73.0 in | Wt 217.8 lb

## 2016-08-06 DIAGNOSIS — E291 Testicular hypofunction: Secondary | ICD-10-CM

## 2016-08-06 DIAGNOSIS — E034 Atrophy of thyroid (acquired): Secondary | ICD-10-CM | POA: Diagnosis not present

## 2016-08-06 DIAGNOSIS — K219 Gastro-esophageal reflux disease without esophagitis: Secondary | ICD-10-CM

## 2016-08-06 DIAGNOSIS — I10 Essential (primary) hypertension: Secondary | ICD-10-CM

## 2016-08-06 DIAGNOSIS — M1711 Unilateral primary osteoarthritis, right knee: Secondary | ICD-10-CM | POA: Diagnosis not present

## 2016-08-06 DIAGNOSIS — E785 Hyperlipidemia, unspecified: Secondary | ICD-10-CM

## 2016-08-06 DIAGNOSIS — Z0000 Encounter for general adult medical examination without abnormal findings: Secondary | ICD-10-CM | POA: Diagnosis not present

## 2016-08-06 DIAGNOSIS — Z125 Encounter for screening for malignant neoplasm of prostate: Secondary | ICD-10-CM | POA: Diagnosis not present

## 2016-08-06 DIAGNOSIS — R7302 Impaired glucose tolerance (oral): Secondary | ICD-10-CM

## 2016-08-06 DIAGNOSIS — N5203 Combined arterial insufficiency and corporo-venous occlusive erectile dysfunction: Secondary | ICD-10-CM

## 2016-08-06 DIAGNOSIS — E7800 Pure hypercholesterolemia, unspecified: Secondary | ICD-10-CM | POA: Diagnosis not present

## 2016-08-06 DIAGNOSIS — N529 Male erectile dysfunction, unspecified: Secondary | ICD-10-CM

## 2016-08-06 DIAGNOSIS — N289 Disorder of kidney and ureter, unspecified: Secondary | ICD-10-CM | POA: Diagnosis not present

## 2016-08-06 LAB — POCT URINALYSIS DIP (MANUAL ENTRY)
Bilirubin, UA: NEGATIVE
GLUCOSE UA: NEGATIVE
Ketones, POC UA: NEGATIVE
Leukocytes, UA: NEGATIVE
NITRITE UA: NEGATIVE
RBC UA: NEGATIVE
UROBILINOGEN UA: 0.2
pH, UA: 5.5

## 2016-08-06 MED ORDER — IRBESARTAN 300 MG PO TABS: ORAL_TABLET | ORAL | 3 refills | Status: DC

## 2016-08-06 MED ORDER — LEVOTHYROXINE SODIUM 125 MCG PO TABS: 125.0000 ug | ORAL_TABLET | Freq: Every day | ORAL | 3 refills | Status: DC

## 2016-08-06 MED ORDER — PANTOPRAZOLE SODIUM 40 MG PO TBEC: 40.0000 mg | DELAYED_RELEASE_TABLET | Freq: Every day | ORAL | 3 refills | Status: DC

## 2016-08-06 MED ORDER — TADALAFIL 20 MG PO TABS: 20.0000 mg | ORAL_TABLET | Freq: Every day | ORAL | 11 refills | Status: DC | PRN

## 2016-08-06 MED ORDER — FENOFIBRATE 160 MG PO TABS
160.0000 mg | ORAL_TABLET | Freq: Every day | ORAL | 3 refills | Status: DC
Start: 2016-08-06 — End: 2016-08-27

## 2016-08-06 MED ORDER — HYDROCHLOROTHIAZIDE 12.5 MG PO TABS: 12.5000 mg | ORAL_TABLET | Freq: Every day | ORAL | 3 refills | Status: DC

## 2016-08-06 NOTE — Patient Instructions (Addendum)
   IF you received an x-ray today, you will receive an invoice from [facility]. Please contact [facility] at [phone] with questions or concerns regarding your invoice.   IF you received labwork today, you will receive an invoice from [facility]. Please contact [facility] at [phone] with questions or concerns regarding your invoice.   Our billing staff will not be able to assist you with questions regarding bills from these companies.  You will be contacted with the lab results as soon as they are available. The fastest way to get your results is to activate your My Chart account. Instructions are located on the last Husser of this paperwork. If you have not heard from us regarding the results in 2 weeks, please contact this office.     Keeping you healthy  Get these tests  Blood pressure- Have your blood pressure checked once a year by your healthcare provider.  Normal blood pressure is 120/80  Weight- Have your body mass index (BMI) calculated to screen for obesity.  BMI is a measure of body fat based on height and weight. You can also calculate your own BMI at [email or web address]/bmi/.  Cholesterol- Have your cholesterol checked every year.  Diabetes- Have your blood sugar checked regularly if you have high blood pressure, high cholesterol, have a family history of diabetes or if you are overweight.  Screening for Colon Cancer- Colonoscopy starting at age 50.  Screening may begin sooner depending on your family history and other health conditions. Follow up colonoscopy as directed by your Gastroenterologist.  Screening for Prostate Cancer- Both blood work (PSA) and a rectal exam help screen for Prostate Cancer.  Screening begins at age 40 with African-American men and at age 50 with Caucasian men.  Screening may begin sooner depending on your family history.  Take these medicines  Aspirin- One aspirin daily can help prevent Heart disease and Stroke.  Flu  shot- Every fall.  Tetanus- Every 10 years.  Zostavax- Once after the age of 60 to prevent Shingles.  Pneumonia shot- Once after the age of 65; if you are younger than 65, ask your healthcare provider if you need a Pneumonia shot.  Take these steps  Don't smoke- If you do smoke, talk to your doctor about quitting.  For tips on how to quit, go to www.smokefree.gov or call 1-800-QUIT-NOW.  Be physically active- Exercise 5 days a week for at least 30 minutes.  If you are not already physically active start slow and gradually work up to 30 minutes of moderate physical activity.  Examples of moderate activity include walking briskly, mowing the yard, dancing, swimming, bicycling, etc.  Eat a healthy diet- Eat a variety of healthy food such as fruits, vegetables, low fat milk, low fat cheese, yogurt, lean meant, poultry, fish, beans, tofu, etc. For more information go to www.thenutritionsource.org  Drink alcohol in moderation- Limit alcohol intake to less than two drinks a day. Never drink and drive.  Dentist- Brush and floss twice daily; visit your dentist twice a year.  Depression- Your emotional health is as important as your physical health. If you're feeling down, or losing interest in things you would normally enjoy please talk to your healthcare provider.  Eye exam- Visit your eye doctor every year.  Safe sex- If you may be exposed to a sexually transmitted infection, use a condom.  Seat belts- Seat belts can save your life; always wear one.  Smoke/Carbon Monoxide detectors- These detectors need to be installed on the appropriate   level of your home.  Replace batteries at least once a year.  Skin cancer- When out in the sun, cover up and use sunscreen 15 SPF or higher.  Violence- If anyone is threatening you, please tell your healthcare provider.  Living Will/ Health care power of attorney- Speak with your healthcare provider and family. 

## 2016-08-06 NOTE — Progress Notes (Signed)
Subjective:    Patient ID: Francisco Gallagher, male    DOB: 21-Jan-1961, 56 y.o.   MRN: [id number]  08/06/2016  Annual Exam and Medication Refill (SEE MEDICATION LIST with RF )   HPI This 56 y.o. male presents for Routine Physical Examination.  Last physical: 08-07-2015 Colonoscopy:  2016 Eye exam: 09/2015; glasses Dental exam:  Scheduled today.   Immunization History  Administered Date(s) Administered  . Tdap 01/18/2008   BP Readings from Last 3 Encounters:  08/06/16 126/80  07/23/16 120/86  06/25/16 130/66   Wt Readings from Last 3 Encounters:  08/06/16 217 lb 12.8 oz (98.8 kg)  07/23/16 215 lb 9.6 oz (97.8 kg)  06/25/16 223 lb (101.2 kg)    REFUSE FLU VACCINES.   Review of Systems  Constitutional: Negative for activity change, appetite change, chills, diaphoresis, fatigue, fever and unexpected weight change.  HENT: Negative for congestion, dental problem, drooling, ear discharge, ear pain, facial swelling, hearing loss, mouth sores, nosebleeds, postnasal drip, rhinorrhea, sinus pressure, sneezing, sore throat, tinnitus, trouble swallowing and voice change.   Eyes: Negative for photophobia, pain, discharge, redness, itching and visual disturbance.  Respiratory: Negative for apnea, cough, choking, chest tightness, shortness of breath, wheezing and stridor.   Cardiovascular: Negative for chest pain, palpitations and leg swelling.  Gastrointestinal: Negative for abdominal pain, blood in stool, constipation, diarrhea, nausea and vomiting.  Endocrine: Negative for cold intolerance, heat intolerance, polydipsia, polyphagia and polyuria.  Genitourinary: Negative for decreased urine volume, difficulty urinating, discharge, dysuria, enuresis, flank pain, frequency, genital sores, hematuria, penile pain, penile swelling, scrotal swelling, testicular pain and urgency.       Nocturia x 0.  Urine stream is strong.    Musculoskeletal: Positive for arthralgias and back pain. Negative for  gait problem, joint swelling, myalgias, neck pain and neck stiffness.  Skin: Negative for color change, pallor, rash and wound.  Allergic/Immunologic: Negative for environmental allergies, food allergies and immunocompromised state.  Neurological: Negative for dizziness, tremors, seizures, syncope, facial asymmetry, speech difficulty, weakness, light-headedness, numbness and headaches.  Hematological: Negative for adenopathy. Does not bruise/bleed easily.  Psychiatric/Behavioral: Negative for agitation, behavioral problems, confusion, decreased concentration, dysphoric mood, hallucinations, self-injury, sleep disturbance and suicidal ideas. The patient is not nervous/anxious and is not hyperactive.        Bedtime 11:00pm; wakes up 4:30am.     Past Medical History:  Diagnosis Date  . Arthritis    knees  . Erectile dysfunction    Cialis PRN  . GERD (gastroesophageal reflux disease)   . Glucose intolerance (impaired glucose tolerance)   . Hyperlipidemia   . Hypertension   . Hypogonadism male 08/23/2011   s/p urology consult [facility]; no treatment indicated due to potential of pregnancy.  . Mild sleep apnea 2009   NEVER WAS GIVEN CPAP PER PT  . Multinodular goiter 07/23/2007   thyroid u/s: multinodular goiter; s/p ENT consult/[facility]:  Rx for Synthroid.  . Pain in joint, site unspecified   . Renal insufficiency    CYST ON KIDNEYS  . Thyroid disease    per pt low thyroid  . Tobacco use disorder   . Unspecified disorder of skin and subcutaneous tissue   . Unspecified hypothyroidism    Past Surgical History:  Procedure Laterality Date  . CARDIAC CATHETERIZATION  07/23/1999   negative.  . CHOLECYSTECTOMY  2003  . excision nosdule   L shouldher Left 2 2014  . INSERTION OF MESH N/A 02/27/2016   Procedure: INSERTION OF MESH;  Surgeon:  Seeplaputhur Robinette Haines, MD;  Location: [facility] ORS;  Service: General;  Laterality: N/A;  . pilonydal cyst  1981  . Sleep Study  07/23/2007  . VENTRAL HERNIA  REPAIR N/A 02/27/2016   Procedure: HERNIA REPAIR VENTRAL ADULT;  Surgeon: Christene Lye, MD;  Location: [facility] OR[facility];  Service: General;  Laterality: N/A;   Allergies  Allergen Reactions  . Penicillins Rash    Has patient had a PCN reaction causing immediate rash, facial/tongue/throat swelling, SOB or lightheadedness with hypotension: rash Has patient had a PCN reaction causing severe rash involving mucus membranes or skin necrosis: no Has patient had a PCN reaction that required hospitalization no Has patient had a PCN reaction occurring within the last 10 years:no If all of the above answers are "NO", then may proceed with Cephalosporin use.   Current Outpatient Prescriptions  Medication Sig Dispense Refill  . aspirin 81 MG tablet Take 81 mg by mouth as needed.     . fenofibrate 160 MG tablet Take 1 tablet (160 mg total) by mouth daily. 90 tablet 3  . hydrochlorothiazide (HYDRODIURIL) 12.5 MG tablet Take 1 tablet (12.5 mg total) by mouth daily. 90 tablet 3  . irbesartan (AVAPRO) 300 MG tablet TAKE 1 TABLET (300 MG TOTAL) BY MOUTH DAILY. 90 tablet 3  . levothyroxine (SYNTHROID, LEVOTHROID) 125 MCG tablet Take 1 tablet (125 mcg total) by mouth daily before breakfast. 90 tablet 3  . methocarbamol (ROBAXIN) 500 MG tablet Take 1 tablet (500 mg total) by mouth every 6 (six) hours as needed for muscle spasms. 45 tablet 0  . Multiple Vitamin (MULTIVITAMIN WITH MINERALS) TABS tablet Take 1 tablet by mouth daily.    . pantoprazole (PROTONIX) 40 MG tablet Take 1 tablet (40 mg total) by mouth daily. 90 tablet 3  . tadalafil (CIALIS) 20 MG tablet Take 1 tablet (20 mg total) by mouth daily as needed. 8 tablet 11   No current facility-administered medications for this visit.    Social History   Social History  . Marital status: Single    Spouse name: N/A  . Number of children: 0  . Years of education: N/A   Occupational History  . 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 Smoking status: Never Smoker  . Smokeless tobacco: Current User    Types: Chew     Comment: PATIENT CHEWS TOBACCO  20 years  . Alcohol use Yes     Comment:  5 TIMES/YEAR - BEER AND LIQUOR  . Drug use: No  . Sexual activity: Yes   Other Topics Concern  . Not on file   Social History Narrative   Marital status: single; girlfriend passed in 02/2013 of breast cancer age 79.  Not dating in 2018.      Children: none      Lives: with father; mother passed away 11-24-13.      Employment:  Works at [employer] in [place] x 29 years; happy      Tobacco:  Chews tobacco x 27 years      Alcohol:  5 times per year at [employer]      Drugs:  None      Exercise: sporadic; bicycle stationary sporadically in 2018      Seatbelt:  50% of time; no texting      Guns: loaded secured guns in home.       Sexual activity: sexually active;  no STDs; total sexual partners < 10.        Smoke alarm and carbon monoxide detector in the home.      Caffeine use: carbonated beverages, moderate amount.   Family History  Problem Relation Age of Onset  . Arthritis Mother   . Hypertension Mother   . COPD Mother   . Depression Mother   . Fibromyalgia Mother   . Gout Mother   . Kidney disease Mother   . Cancer Mother     lung  . Heart disease Mother 9    AMI s/p stenting  . Hypertension Father   . Benign prostatic hyperplasia Father   . Rosacea Father   . Arthritis Father   . Arthritis Brother   . Benign prostatic hyperplasia Brother   . Heart disease Maternal Grandfather   . Hypertension Paternal Grandmother   . Stroke Paternal Grandfather   . Diabetes    . Colon cancer Neg Hx        Objective:    BP 126/80 (BP Location: Right Arm, Patient Position: Sitting, Cuff Size: Normal)   Pulse 79   Temp 98 F (36.7 C) (Oral)   Resp 16   Ht 6\' 1"  (1.854 m)   Wt 217 lb 12.8 oz (98.8 kg)   SpO2 96%   BMI 28.74 kg/m  Physical Exam  Constitutional: He is oriented to person,  place, and time. He appears well-developed and well-nourished. No distress.  HENT:  Head: Normocephalic and atraumatic.  Right Ear: External ear normal.  Left Ear: External ear normal.  Nose: Nose normal.  Mouth/Throat: Oropharynx is clear and moist.  Eyes: Conjunctivae and EOM are normal. Pupils are equal, round, and reactive to light.  Neck: Normal range of motion. Neck supple. Carotid bruit is not present. No thyromegaly present.  Cardiovascular: Normal rate, regular rhythm, normal heart sounds and intact distal pulses.  Exam reveals no gallop and no friction rub.   No murmur heard. Pulmonary/Chest: Effort normal and breath sounds normal. He has no wheezes. He has no rales.  Abdominal: Soft. Bowel sounds are normal. He exhibits no distension and no mass. There is no tenderness. There is no rebound and no guarding. Hernia confirmed negative in the right inguinal area and confirmed negative in the left inguinal area.  Genitourinary: Testes normal and penis normal.  Musculoskeletal:       Right shoulder: Normal.       Left shoulder: Normal.       Cervical back: Normal.  Lymphadenopathy:    He has no cervical adenopathy.       Right: No inguinal adenopathy present.       Left: No inguinal adenopathy present.  Neurological: He is alert and oriented to person, place, and time. He has normal reflexes. No cranial nerve deficit. He exhibits normal muscle tone. Coordination normal.  Skin: Skin is warm and dry. No rash noted. He is not diaphoretic.  Psychiatric: He has a normal mood and affect. His behavior is normal. Judgment and thought content normal.   Depression screen [facility] 2/9 08/06/2016 07/23/2016 06/25/2016 02/06/2016 08/07/2015  Decreased Interest 0 0 0 0 0  Down, Depressed, Hopeless 0 0 0 0 0  PHQ - 2 Score 0 0 0 0 0        Assessment & Plan:   1. Routine physical examination   2. Essential hypertension, benign   3. Gastroesophageal reflux disease without esophagitis   4.  Hypothyroidism due to acquired atrophy of thyroid  5. Hypogonadism male   6. Glucose intolerance (impaired glucose tolerance)   7. Primary osteoarthritis of right knee   8. Combined arterial insufficiency and corporo-venous occlusive erectile dysfunction   9. Pure hypercholesterolemia   10. Renal insufficiency   11. Screening for prostate cancer   12. Dyslipidemia   13. Erectile dysfunction, unspecified erectile dysfunction type     Orders Placed This Encounter  Procedures  . CBC with Differential/Platelet  . Comprehensive metabolic panel    Order Specific Question:   Has the patient fasted?    Answer:   Yes  . Lipid panel    Order Specific Question:   Has the patient fasted?    Answer:   Yes  . PSA  . TSH  . T4, free  . Hemoglobin A1c  . POCT urinalysis dipstick  . EKG 12-Lead   Meds ordered this encounter  Medications  . fenofibrate 160 MG tablet    Sig: Take 1 tablet (160 mg total) by mouth daily.    Dispense:  90 tablet    Refill:  3  . hydrochlorothiazide (HYDRODIURIL) 12.5 MG tablet    Sig: Take 1 tablet (12.5 mg total) by mouth daily.    Dispense:  90 tablet    Refill:  3  . irbesartan (AVAPRO) 300 MG tablet    Sig: TAKE 1 TABLET (300 MG TOTAL) BY MOUTH DAILY.    Dispense:  90 tablet    Refill:  3  . levothyroxine (SYNTHROID, LEVOTHROID) 125 MCG tablet    Sig: Take 1 tablet (125 mcg total) by mouth daily before breakfast.    Dispense:  90 tablet    Refill:  3  . pantoprazole (PROTONIX) 40 MG tablet    Sig: Take 1 tablet (40 mg total) by mouth daily.    Dispense:  90 tablet    Refill:  3  . tadalafil (CIALIS) 20 MG tablet    Sig: Take 1 tablet (20 mg total) by mouth daily as needed.    Dispense:  8 tablet    Refill:  11    Return in about 6 months (around 02/03/2017) for recheck high blood pressure, high cholesterol, prediabetes, thyroid.   Jeremiah Curci Elayne Guerin, M.D. [facility] [address] [place], [place]   [phone] phone [phone] fax

## 2016-08-07 LAB — CBC WITH DIFFERENTIAL/PLATELET
Basophils Absolute: 0 10*3/uL (ref 0.0–0.2)
Basos: 1 %
EOS (ABSOLUTE): 0.4 10*3/uL (ref 0.0–0.4)
EOS: 5 %
HEMATOCRIT: 42.7 % (ref 37.5–51.0)
HEMOGLOBIN: 14.9 g/dL (ref 13.0–17.7)
Immature Grans (Abs): 0 10*3/uL (ref 0.0–0.1)
Immature Granulocytes: 0 %
LYMPHS ABS: 1.3 10*3/uL (ref 0.7–3.1)
Lymphs: 17 %
MCH: 30.8 pg (ref 26.6–33.0)
MCHC: 34.9 g/dL (ref 31.5–35.7)
MCV: 88 fL (ref 79–97)
MONOCYTES: 7 %
Monocytes Absolute: 0.5 10*3/uL (ref 0.1–0.9)
Neutrophils Absolute: 5.3 10*3/uL (ref 1.4–7.0)
Neutrophils: 70 %
Platelets: 263 10*3/uL (ref 150–379)
RBC: 4.84 x10E6/uL (ref 4.14–5.80)
RDW: 14 % (ref 12.3–15.4)
WBC: 7.4 10*3/uL (ref 3.4–10.8)

## 2016-08-07 LAB — COMPREHENSIVE METABOLIC PANEL
ALBUMIN: 4.8 g/dL (ref 3.5–5.5)
ALK PHOS: 38 IU/L — AB (ref 39–117)
ALT: 36 IU/L (ref 0–44)
AST: 19 IU/L (ref 0–40)
Albumin/Globulin Ratio: 1.9 (ref 1.2–2.2)
BILIRUBIN TOTAL: 0.4 mg/dL (ref 0.0–1.2)
BUN / CREAT RATIO: 10 (ref 9–20)
BUN: 14 mg/dL (ref 6–24)
CHLORIDE: 104 mmol/L (ref 96–106)
CO2: 21 mmol/L (ref 18–29)
Calcium: 9.5 mg/dL (ref 8.7–10.2)
Creatinine, Ser: 1.39 mg/dL — ABNORMAL HIGH (ref 0.76–1.27)
GFR calc Af Amer: 66 mL/min/{1.73_m2} (ref >59)
GFR calc non Af Amer: 57 mL/min/{1.73_m2} — ABNORMAL LOW (ref >59)
GLOBULIN, TOTAL: 2.5 g/dL (ref 1.5–4.5)
Glucose: 107 mg/dL — ABNORMAL HIGH (ref 65–99)
Potassium: 4.6 mmol/L (ref 3.5–5.2)
Sodium: 139 mmol/L (ref 134–144)
Total Protein: 7.3 g/dL (ref 6.0–8.5)

## 2016-08-07 LAB — LIPID PANEL
CHOL/HDL RATIO: 5 ratio (ref 0.0–5.0)
CHOLESTEROL TOTAL: 165 mg/dL (ref 100–199)
HDL: 33 mg/dL — ABNORMAL LOW (ref >39)
LDL Calculated: 113 mg/dL — ABNORMAL HIGH (ref 0–99)
Triglycerides: 96 mg/dL (ref 0–149)
VLDL Cholesterol Cal: 19 mg/dL (ref 5–40)

## 2016-08-07 LAB — HEMOGLOBIN A1C
Est. average glucose Bld gHb Est-mCnc: 134 mg/dL
HEMOGLOBIN A1C: 6.3 % — AB (ref 4.8–5.6)

## 2016-08-07 LAB — TSH: TSH: 4.08 u[IU]/mL (ref 0.450–4.500)

## 2016-08-07 LAB — T4, FREE: Free T4: 1.56 ng/dL (ref 0.82–1.77)

## 2016-08-07 LAB — PSA: Prostate Specific Ag, Serum: 3.5 ng/mL (ref 0.0–4.0)

## 2016-08-24 ENCOUNTER — Other Ambulatory Visit: Payer: Self-pay | Admitting: Family Medicine

## 2016-08-25 NOTE — Telephone Encounter (Signed)
Nl tsh 07/2016

## 2016-08-27 ENCOUNTER — Other Ambulatory Visit: Payer: Self-pay | Admitting: Family Medicine

## 2016-08-27 DIAGNOSIS — E785 Hyperlipidemia, unspecified: Secondary | ICD-10-CM

## 2016-11-18 ENCOUNTER — Other Ambulatory Visit: Payer: Self-pay | Admitting: Family Medicine

## 2016-11-18 DIAGNOSIS — I10 Essential (primary) hypertension: Secondary | ICD-10-CM

## 2017-01-04 ENCOUNTER — Other Ambulatory Visit: Payer: Self-pay | Admitting: Family Medicine

## 2017-02-04 ENCOUNTER — Ambulatory Visit (INDEPENDENT_AMBULATORY_CARE_PROVIDER_SITE_OTHER): Payer: 59 | Admitting: Family Medicine

## 2017-02-04 ENCOUNTER — Encounter: Payer: Self-pay | Admitting: Family Medicine

## 2017-02-04 VITALS — BP 131/86 | HR 61 | Temp 98.3°F | Resp 16 | Ht 73.23 in | Wt 222.0 lb

## 2017-02-04 DIAGNOSIS — E7800 Pure hypercholesterolemia, unspecified: Secondary | ICD-10-CM | POA: Diagnosis not present

## 2017-02-04 DIAGNOSIS — K219 Gastro-esophageal reflux disease without esophagitis: Secondary | ICD-10-CM | POA: Diagnosis not present

## 2017-02-04 DIAGNOSIS — E034 Atrophy of thyroid (acquired): Secondary | ICD-10-CM | POA: Diagnosis not present

## 2017-02-04 DIAGNOSIS — F410 Panic disorder [episodic paroxysmal anxiety] without agoraphobia: Secondary | ICD-10-CM

## 2017-02-04 DIAGNOSIS — E785 Hyperlipidemia, unspecified: Secondary | ICD-10-CM | POA: Diagnosis not present

## 2017-02-04 DIAGNOSIS — E291 Testicular hypofunction: Secondary | ICD-10-CM

## 2017-02-04 DIAGNOSIS — N289 Disorder of kidney and ureter, unspecified: Secondary | ICD-10-CM | POA: Diagnosis not present

## 2017-02-04 DIAGNOSIS — I10 Essential (primary) hypertension: Secondary | ICD-10-CM

## 2017-02-04 DIAGNOSIS — R7302 Impaired glucose tolerance (oral): Secondary | ICD-10-CM | POA: Diagnosis not present

## 2017-02-04 LAB — POCT URINALYSIS DIP (MANUAL ENTRY)
BILIRUBIN UA: NEGATIVE
Glucose, UA: NEGATIVE mg/dL
Ketones, POC UA: NEGATIVE mg/dL
LEUKOCYTES UA: NEGATIVE
Nitrite, UA: NEGATIVE
PROTEIN UA: NEGATIVE mg/dL
Spec Grav, UA: >=1.03 — AB (ref 1.010–1.025)
Urobilinogen, UA: 0.2 E.U./dL
pH, UA: 5.5 (ref 5.0–8.0)

## 2017-02-04 MED ORDER — FENOFIBRATE 160 MG PO TABS: 160.0000 mg | ORAL_TABLET | Freq: Every day | ORAL | 1 refills | Status: DC

## 2017-02-04 MED ORDER — IRBESARTAN 300 MG PO TABS: ORAL_TABLET | ORAL | 1 refills | Status: DC

## 2017-02-04 NOTE — Progress Notes (Signed)
Subjective:    Patient ID: Francisco Gallagher, male    DOB: 03-18-1961, 56 y.o.   MRN: [id number]  02/04/2017  Hypertension (6 month follow-up); Hyperlipidemia; and Hypothyroidism   HPI This 56 y.o. male presents for evaluation of hypertension, hypercholesterolemia, hypothyroidism.  No changes to management made at last visit.   Home  BP running 120s/80s.  Running late.   Arthritis pain is worsening.  Not exercising; walking all day long. Lifting and walking all day long.  Not eating a lot of sweets.  Crackers.   Plans to retired in five years.  Got a thirty year pension out of recent buy out. 401K and pension also.  Has a little in original 401K; will start with new company.    Went to [place], [place] with father.  Drank excessive coke.  Drinks water most days except on weekends; drinks two sodas.  Only drank five days during the year; drinks clear liquor.     BP Readings from Last 3 Encounters:  02/04/17 131/86  08/06/16 126/80  07/23/16 120/86   Wt Readings from Last 3 Encounters:  02/04/17 222 lb (100.7 kg)  08/06/16 217 lb 12.8 oz (98.8 kg)  07/23/16 215 lb 9.6 oz (97.8 kg)      Review of Systems  Constitutional: Negative for activity change, appetite change, chills, diaphoresis, fatigue and fever.  Eyes: Negative for visual disturbance.  Respiratory: Negative for cough and shortness of breath.   Cardiovascular: Negative for chest pain, palpitations and leg swelling.  Endocrine: Negative for cold intolerance, heat intolerance, polydipsia, polyphagia and polyuria.  Neurological: Negative for dizziness, tremors, seizures, syncope, facial asymmetry, speech difficulty, weakness, light-headedness, numbness and headaches.    Past Medical History:  Diagnosis Date  . Arthritis    knees  . Erectile dysfunction    Cialis PRN  . GERD (gastroesophageal reflux disease)   . Glucose intolerance (impaired glucose tolerance)   . Hyperlipidemia   . Hypertension   . Hypogonadism male  08/23/2011   s/p urology consult [facility]; no treatment indicated due to potential of pregnancy.  . Mild sleep apnea 2009   NEVER WAS GIVEN CPAP PER PT  . Multinodular goiter 07/23/2007   thyroid u/s: multinodular goiter; s/p ENT consult/[facility]:  Rx for Synthroid.  . Pain in joint, site unspecified   . Renal insufficiency    CYST ON KIDNEYS  . Thyroid disease    per pt low thyroid  . Tobacco use disorder   . Unspecified disorder of skin and subcutaneous tissue   . Unspecified hypothyroidism    Past Surgical History:  Procedure Laterality Date  . CARDIAC CATHETERIZATION  07/23/1999   negative.  . CHOLECYSTECTOMY  2003  . excision nosdule   L shouldher Left 2 2014  . INSERTION OF MESH N/A 02/27/2016   Procedure: INSERTION OF MESH;  Surgeon: Christene Lye, MD;  Location: [facility];  Service: General;  Laterality: N/A;  . pilonydal cyst  1981  . Sleep Study  07/23/2007  . VENTRAL HERNIA REPAIR N/A 02/27/2016   Procedure: HERNIA REPAIR VENTRAL ADULT;  Surgeon: Christene Lye, MD;  Location: [facility];  Service: General;  Laterality: N/A;   Allergies  Allergen Reactions  . Penicillins Rash    Has patient had a PCN reaction causing immediate rash, facial/tongue/throat swelling, SOB or lightheadedness with hypotension: rash Has patient had a PCN reaction causing severe rash involving mucus membranes or skin necrosis: no Has patient had a PCN reaction that required hospitalization no  Has patient had a PCN reaction occurring within the last 10 years:no If all of the above answers are "NO", then may proceed with Cephalosporin use.   Current Outpatient Prescriptions  Medication Sig Dispense Refill  . aspirin 81 MG tablet Take 81 mg by mouth as needed.     . diclofenac sodium (VOLTAREN) 1 % GEL APPLY 2 G TOPICALLY 4 (FOUR) TIMES DAILY. 100 g 1  . fenofibrate 160 MG tablet Take 1 tablet (160 mg total) by mouth daily. 90 tablet 1  . hydrochlorothiazide (HYDRODIURIL) 12.5 MG  tablet Take 1 tablet (12.5 mg total) by mouth daily. 90 tablet 3  . irbesartan (AVAPRO) 300 MG tablet TAKE 1 TABLET (300 MG TOTAL) BY MOUTH DAILY. 90 tablet 1  . levothyroxine (SYNTHROID, LEVOTHROID) 125 MCG tablet Take 1 tablet (125 mcg total) by mouth daily before breakfast. 90 tablet 3  . levothyroxine (SYNTHROID, LEVOTHROID) 125 MCG tablet TAKE 1 TABLET (125 MCG TOTAL) BY MOUTH DAILY BEFORE BREAKFAST. 90 tablet 1  . Multiple Vitamin (MULTIVITAMIN WITH MINERALS) TABS tablet Take 1 tablet by mouth daily.    . pantoprazole (PROTONIX) 40 MG tablet Take 1 tablet (40 mg total) by mouth daily. 90 tablet 3  . tadalafil (CIALIS) 20 MG tablet Take 1 tablet (20 mg total) by mouth daily as needed. 8 tablet 11   No current facility-administered medications for this visit.    Social History   Social History  . Marital status: Single    Spouse name: N/A  . Number of children: 0  . Years of education: N/A   Occupational History  . 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 Smoking status: Never Smoker  . Smokeless tobacco: Current User    Types: Chew     Comment: PATIENT CHEWS TOBACCO  20 years  . Alcohol use Yes     Comment:  5 TIMES/YEAR - BEER AND LIQUOR  . Drug use: No  . Sexual activity: Yes   Other Topics Concern  . Not on file   Social History Narrative   Marital status: single; girlfriend passed in 02/2013 of breast cancer age 84.  Not dating in 2018.      Children: none      Lives: with father; mother passed away 12-18-2013.      Employment:  Works at [employer] in [place] x 29 years; happy      Tobacco:  Chews tobacco x 27 years      Alcohol:  5 times per year at [employer]      Drugs:  None      Exercise: sporadic; bicycle stationary sporadically in 2018      Seatbelt:  50% of time; no texting      Guns: loaded secured guns in home.       Sexual activity: sexually active; no STDs; total sexual partners < 10.        Smoke alarm and  carbon monoxide detector in the home.      Caffeine use: carbonated beverages, moderate amount.   Family History  Problem Relation Age of Onset  . Arthritis Mother   . Hypertension Mother   . COPD Mother   . Depression Mother   . Fibromyalgia Mother   . Gout Mother   . Kidney disease Mother   . Cancer Mother        lung  . Heart disease Mother 73  AMI s/p stenting  . Hypertension Father   . Benign prostatic hyperplasia Father   . Rosacea Father   . Arthritis Father   . Arthritis Brother   . Benign prostatic hyperplasia Brother   . Heart disease Maternal Grandfather   . Hypertension Paternal Grandmother   . Stroke Paternal Grandfather   . Diabetes Unknown   . Colon cancer Neg Hx        Objective:    BP 131/86   Pulse 61   Temp 98.3 F (36.8 C) (Oral)   Resp 16   Ht 6' 1.23" (1.86 m)   Wt 222 lb (100.7 kg)   SpO2 (!) 5%   BMI 29.11 kg/m  Physical Exam  Constitutional: He is oriented to person, place, and time. He appears well-developed and well-nourished. No distress.  HENT:  Head: Normocephalic and atraumatic.  Right Ear: External ear normal.  Left Ear: External ear normal.  Nose: Nose normal.  Mouth/Throat: Oropharynx is clear and moist.  Eyes: Pupils are equal, round, and reactive to light. Conjunctivae and EOM are normal.  Neck: Normal range of motion. Neck supple. Carotid bruit is not present. No thyromegaly present.  Cardiovascular: Normal rate, regular rhythm, normal heart sounds and intact distal pulses.  Exam reveals no gallop and no friction rub.   No murmur heard. Pulmonary/Chest: Effort normal and breath sounds normal. He has no wheezes. He has no rales.  Abdominal: Soft. Bowel sounds are normal. He exhibits no distension and no mass. There is no tenderness. There is no rebound and no guarding.  Lymphadenopathy:    He has no cervical adenopathy.  Neurological: He is alert and oriented to person, place, and time. No cranial nerve deficit.    Skin: Skin is warm and dry. No rash noted. He is not diaphoretic.  Psychiatric: He has a normal mood and affect. His behavior is normal.  Nursing note and vitals reviewed.       Assessment & Plan:   1. Essential hypertension, benign   2. Gastroesophageal reflux disease without esophagitis   3. Glucose intolerance (impaired glucose tolerance)   4. Hypogonadism male   5. Hypothyroidism due to acquired atrophy of thyroid   6. Renal insufficiency   7. Pure hypercholesterolemia   8. Panic attack   9. Dyslipidemia    -controled; obtain labs; continue current medications.  I recommend weight loss, exercise, and low-carbohydrate low-sugar food choices. You should AVOID: regular sodas, sweetened tea, fruit juices.  You should LIMIT: breads, pastas, rice, potatoes, and desserts/sweets.  I would recommend limiting your total carbohydrate intake per meal to 45 grams; I would limit your total carbohydrate intake per snack to 30 grams.  I would also have a goal of 60 grams of protein intake per day; this would equal 10-15 grams of protein per meal and 5-10 grams of protein per snack.  Orders Placed This Encounter  Procedures  . CBC with Differential/Platelet  . Comprehensive metabolic panel    Order Specific Question:   Has the patient fasted?    Answer:   Yes  . Hemoglobin A1c  . Lipid panel    Order Specific Question:   Has the patient fasted?    Answer:   Yes  . T4, free  . TSH  . POCT urinalysis dipstick   Meds ordered this encounter  Medications  . fenofibrate 160 MG tablet    Sig: Take 1 tablet (160 mg total) by mouth daily.    Dispense:  90 tablet  Refill:  1  . irbesartan (AVAPRO) 300 MG tablet    Sig: TAKE 1 TABLET (300 MG TOTAL) BY MOUTH DAILY.    Dispense:  90 tablet    Refill:  1    Return in about 6 months (around 08/07/2017) for complete physical examiniation.   Lawan Nanez Elayne Guerin, M.D. [facility] at [facility] previously [facility] [address] [place], [place]  [phone] phone [phone] fax

## 2017-02-04 NOTE — Patient Instructions (Addendum)
   IF you received an x-ray today, you will receive an invoice from [facility]. Please contact [facility] at [phone] with questions or concerns regarding your invoice.   IF you received labwork today, you will receive an invoice from [facility]. Please contact [facility] at [phone] with questions or concerns regarding your invoice.   Our billing staff will not be able to assist you with questions regarding bills from these companies.  You will be contacted with the lab results as soon as they are available. The fastest way to get your results is to activate your My Chart account. Instructions are located on the last Chong of this paperwork. If you have not heard from us regarding the results in 2 weeks, please contact this office.     Stress and Stress Management Stress is a normal reaction to life events. It is what you feel when life demands more than you are used to or more than you can handle. Some stress can be useful. For example, the stress reaction can help you catch the last bus of the day, study for a test, or meet a deadline at work. But stress that occurs too often or for too long can cause problems. It can affect your emotional health and interfere with relationships and normal daily activities. Too much stress can weaken your immune system and increase your risk for physical illness. If you already have a medical problem, stress can make it worse. What are the causes? All sorts of life events may cause stress. An event that causes stress for one person may not be stressful for another person. Major life events commonly cause stress. These may be positive or negative. Examples include losing your job, moving into a new home, getting married, having a baby, or losing a loved one. Less obvious life events may also cause stress, especially if they occur day after day or in combination. Examples include working long hours, driving in traffic, caring for children,  being in debt, or being in a difficult relationship. What are the signs or symptoms? Stress may cause emotional symptoms including, the following:  Anxiety. This is feeling worried, afraid, on edge, overwhelmed, or out of control.  Anger. This is feeling irritated or impatient.  Depression. This is feeling sad, down, helpless, or guilty.  Difficulty focusing, remembering, or making decisions. Stress may cause physical symptoms, including the following:  Aches and pains. These may affect your head, neck, back, stomach, or other areas of your body.  Tight muscles or clenched jaw.  Low energy or trouble sleeping. Stress may cause unhealthy behaviors, including the following:  Eating to feel better (overeating) or skipping meals.  Sleeping too little, too much, or both.  Working too much or putting off tasks (procrastination).  Smoking, drinking alcohol, or using drugs to feel better. How is this diagnosed? Stress is diagnosed through an assessment by your health care provider. Your health care provider will ask questions about your symptoms and any stressful life events.Your health care provider will also ask about your medical history and may order blood tests or other tests. Certain medical conditions and medicine can cause physical symptoms similar to stress. Mental illness can cause emotional symptoms and unhealthy behaviors similar to stress. Your health care provider may refer you to a mental health professional for further evaluation. How is this treated? Stress management is the recommended treatment for stress.The goals of stress management are reducing stressful life events and coping with stress in healthy ways. Techniques   for reducing stressful life events include the following:  Stress identification. Self-monitor for stress and identify what causes stress for you. These skills may help you to avoid some stressful events.  Time management. Set your priorities, keep a  calendar of events, and learn to say "no." These tools can help you avoid making too many commitments. Techniques for coping with stress include the following:  Rethinking the problem. Try to think realistically about stressful events rather than ignoring them or overreacting. Try to find the positives in a stressful situation rather than focusing on the negatives.  Exercise. Physical exercise can release both physical and emotional tension. The key is to find a form of exercise you enjoy and do it regularly.  Relaxation techniques. These relax the body and mind. Examples include yoga, meditation, tai chi, biofeedback, deep breathing, progressive muscle relaxation, listening to music, being out in nature, journaling, and other hobbies. Again, the key is to find one or more that you enjoy and can do regularly.  Healthy lifestyle. Eat a balanced diet, get plenty of sleep, and do not smoke. Avoid using alcohol or drugs to relax.  Strong support network. Spend time with family, friends, or other people you enjoy being around.Express your feelings and talk things over with someone you trust. Counseling or talktherapy with a mental health professional may be helpful if you are having difficulty managing stress on your own. Medicine is typically not recommended for the treatment of stress.Talk to your health care provider if you think you need medicine for symptoms of stress. Follow these instructions at home:  Keep all follow-up visits as directed by your health care provider.  Take all medicines as directed by your health care provider. Contact a health care provider if:  Your symptoms get worse or you start having new symptoms.  You feel overwhelmed by your problems and can no longer manage them on your own. Get help right away if:  You feel like hurting yourself or someone else. This information is not intended to replace advice given to you by your health care provider. Make sure you  discuss any questions you have with your health care provider. Document Released: 01/01/2001 Document Revised: 12/14/2015 Document Reviewed: 03/02/2013 Elsevier Interactive Patient Education  2017 Elsevier Inc.  

## 2017-02-05 LAB — CBC WITH DIFFERENTIAL/PLATELET
BASOS: 1 %
Basophils Absolute: 0 10*3/uL (ref 0.0–0.2)
EOS (ABSOLUTE): 0.4 10*3/uL (ref 0.0–0.4)
EOS: 5 %
HEMATOCRIT: 41.3 % (ref 37.5–51.0)
HEMOGLOBIN: 13.8 g/dL (ref 13.0–17.7)
IMMATURE GRANS (ABS): 0 10*3/uL (ref 0.0–0.1)
IMMATURE GRANULOCYTES: 0 %
LYMPHS: 22 %
Lymphocytes Absolute: 1.4 10*3/uL (ref 0.7–3.1)
MCH: 30.1 pg (ref 26.6–33.0)
MCHC: 33.4 g/dL (ref 31.5–35.7)
MCV: 90 fL (ref 79–97)
MONOCYTES: 7 %
MONOS ABS: 0.5 10*3/uL (ref 0.1–0.9)
NEUTROS PCT: 65 %
Neutrophils Absolute: 4.2 10*3/uL (ref 1.4–7.0)
Platelets: 241 10*3/uL (ref 150–379)
RBC: 4.58 x10E6/uL (ref 4.14–5.80)
RDW: 14.1 % (ref 12.3–15.4)
WBC: 6.5 10*3/uL (ref 3.4–10.8)

## 2017-02-05 LAB — COMPREHENSIVE METABOLIC PANEL
A/G RATIO: 2.6 — AB (ref 1.2–2.2)
ALBUMIN: 5.2 g/dL (ref 3.5–5.5)
ALT: 41 IU/L (ref 0–44)
AST: 25 IU/L (ref 0–40)
Alkaline Phosphatase: 33 IU/L — ABNORMAL LOW (ref 39–117)
BUN/Creatinine Ratio: 12 (ref 9–20)
BUN: 16 mg/dL (ref 6–24)
Bilirubin Total: 0.6 mg/dL (ref 0.0–1.2)
CALCIUM: 9.9 mg/dL (ref 8.7–10.2)
CO2: 19 mmol/L — AB (ref 20–29)
CREATININE: 1.31 mg/dL — AB (ref 0.76–1.27)
Chloride: 104 mmol/L (ref 96–106)
GFR, EST AFRICAN AMERICAN: 71 mL/min/{1.73_m2} (ref >59)
GFR, EST NON AFRICAN AMERICAN: 61 mL/min/{1.73_m2} (ref >59)
GLOBULIN, TOTAL: 2 g/dL (ref 1.5–4.5)
Glucose: 83 mg/dL (ref 65–99)
POTASSIUM: 4.4 mmol/L (ref 3.5–5.2)
SODIUM: 140 mmol/L (ref 134–144)
TOTAL PROTEIN: 7.2 g/dL (ref 6.0–8.5)

## 2017-02-05 LAB — HEMOGLOBIN A1C
ESTIMATED AVERAGE GLUCOSE: 128 mg/dL
Hgb A1c MFr Bld: 6.1 % — ABNORMAL HIGH (ref 4.8–5.6)

## 2017-02-05 LAB — LIPID PANEL
CHOLESTEROL TOTAL: 158 mg/dL (ref 100–199)
Chol/HDL Ratio: 5.6 ratio — ABNORMAL HIGH (ref 0.0–5.0)
HDL: 28 mg/dL — AB (ref >39)
LDL Calculated: 105 mg/dL — ABNORMAL HIGH (ref 0–99)
Triglycerides: 126 mg/dL (ref 0–149)
VLDL CHOLESTEROL CAL: 25 mg/dL (ref 5–40)

## 2017-02-05 LAB — TSH: TSH: 3.49 u[IU]/mL (ref 0.450–4.500)

## 2017-02-05 LAB — T4, FREE: FREE T4: 1.29 ng/dL (ref 0.82–1.77)

## 2017-03-01 ENCOUNTER — Other Ambulatory Visit: Payer: Self-pay | Admitting: Family Medicine

## 2017-03-01 DIAGNOSIS — E785 Hyperlipidemia, unspecified: Secondary | ICD-10-CM

## 2017-05-16 ENCOUNTER — Other Ambulatory Visit: Payer: Self-pay | Admitting: Family Medicine

## 2017-07-08 ENCOUNTER — Telehealth: Payer: Self-pay | Admitting: Family Medicine

## 2017-07-08 NOTE — Telephone Encounter (Signed)
Called pt to reschedule appt from 08/04/17. Dr. Tamala Julian will not be in the office that day. Please reschedule appt for any day other than 08/04/17 OR 08/13/17  Thanks!

## 2017-07-09 ENCOUNTER — Ambulatory Visit: Payer: 59 | Admitting: Family Medicine

## 2017-08-02 ENCOUNTER — Other Ambulatory Visit: Payer: Self-pay

## 2017-08-02 ENCOUNTER — Encounter: Payer: Self-pay | Admitting: Family Medicine

## 2017-08-02 ENCOUNTER — Ambulatory Visit (INDEPENDENT_AMBULATORY_CARE_PROVIDER_SITE_OTHER): Payer: 59 | Admitting: Family Medicine

## 2017-08-02 VITALS — BP 136/88 | HR 63 | Temp 97.7°F | Resp 18 | Ht 73.66 in | Wt 226.6 lb

## 2017-08-02 DIAGNOSIS — S76211A Strain of adductor muscle, fascia and tendon of right thigh, initial encounter: Secondary | ICD-10-CM | POA: Diagnosis not present

## 2017-08-02 DIAGNOSIS — M722 Plantar fascial fibromatosis: Secondary | ICD-10-CM | POA: Diagnosis not present

## 2017-08-02 DIAGNOSIS — I10 Essential (primary) hypertension: Secondary | ICD-10-CM | POA: Diagnosis not present

## 2017-08-02 DIAGNOSIS — Z23 Encounter for immunization: Secondary | ICD-10-CM | POA: Diagnosis not present

## 2017-08-02 DIAGNOSIS — K219 Gastro-esophageal reflux disease without esophagitis: Secondary | ICD-10-CM

## 2017-08-02 DIAGNOSIS — E034 Atrophy of thyroid (acquired): Secondary | ICD-10-CM | POA: Diagnosis not present

## 2017-08-02 DIAGNOSIS — E7439 Other disorders of intestinal carbohydrate absorption: Secondary | ICD-10-CM | POA: Diagnosis not present

## 2017-08-02 DIAGNOSIS — Z0000 Encounter for general adult medical examination without abnormal findings: Secondary | ICD-10-CM | POA: Diagnosis not present

## 2017-08-02 DIAGNOSIS — N529 Male erectile dysfunction, unspecified: Secondary | ICD-10-CM

## 2017-08-02 DIAGNOSIS — E785 Hyperlipidemia, unspecified: Secondary | ICD-10-CM

## 2017-08-02 DIAGNOSIS — Z125 Encounter for screening for malignant neoplasm of prostate: Secondary | ICD-10-CM | POA: Diagnosis not present

## 2017-08-02 LAB — POCT URINALYSIS DIP (MANUAL ENTRY)
Bilirubin, UA: NEGATIVE
Glucose, UA: NEGATIVE mg/dL
Ketones, POC UA: NEGATIVE mg/dL
LEUKOCYTES UA: NEGATIVE
NITRITE UA: NEGATIVE
PROTEIN UA: NEGATIVE mg/dL
SPEC GRAV UA: 1.02 (ref 1.010–1.025)
UROBILINOGEN UA: 0.2 U/dL
pH, UA: 6.5 (ref 5.0–8.0)

## 2017-08-02 MED ORDER — PANTOPRAZOLE SODIUM 40 MG PO TBEC: 40.0000 mg | DELAYED_RELEASE_TABLET | Freq: Every day | ORAL | 3 refills | Status: AC

## 2017-08-02 MED ORDER — TADALAFIL 20 MG PO TABS: 20.0000 mg | ORAL_TABLET | Freq: Every day | ORAL | 11 refills | Status: AC | PRN

## 2017-08-02 MED ORDER — HYDROCHLOROTHIAZIDE 12.5 MG PO TABS: 12.5000 mg | ORAL_TABLET | Freq: Every day | ORAL | 3 refills | Status: AC

## 2017-08-02 MED ORDER — LEVOTHYROXINE SODIUM 125 MCG PO TABS: 125.0000 ug | ORAL_TABLET | Freq: Every day | ORAL | 3 refills | Status: AC

## 2017-08-02 MED ORDER — IRBESARTAN 300 MG PO TABS: ORAL_TABLET | ORAL | 1 refills | Status: DC

## 2017-08-02 MED ORDER — FENOFIBRATE 160 MG PO TABS: 160.0000 mg | ORAL_TABLET | Freq: Every day | ORAL | 3 refills | Status: AC

## 2017-08-02 MED ORDER — DICLOFENAC SODIUM 1 % TD GEL: 2.0000 g | Freq: Four times a day (QID) | TRANSDERMAL | 1 refills | Status: DC

## 2017-08-02 NOTE — Progress Notes (Signed)
Subjective:    Patient ID: Francisco Gallagher, male    DOB: 02-Feb-1961, 57 y.o.   MRN: [id number]  08/02/2017  Annual Exam    HPI This 57 y.o. male presents for Complete Physical Examination.  Last physical:  08-06-16 Colonoscopy:  2016 PSA: Eye exam:  Glasses; 09/2016 Dental exam:  04/2017   Visual Acuity Screening   Right eye Left eye Both eyes  Without correction:     With correction: 20/25 20/20 20/20     BP Readings from Last 3 Encounters:  08/02/17 136/88  02/04/17 131/86  08/06/16 126/80   Wt Readings from Last 3 Encounters:  08/02/17 226 lb 9.6 oz (102.8 kg)  02/04/17 222 lb (100.7 kg)  08/06/16 217 lb 12.8 oz (98.8 kg)   Immunization History  Administered Date(s) Administered  . Tdap 01/18/2008   Health Maintenance  Topic Date Due  . INFLUENZA VACCINE  04/04/2018 (Originally 02/19/2017)  . TETANUS/TDAP  01/17/2018  . COLONOSCOPY  06/01/2025  . Hepatitis C Screening  Completed  . HIV Screening  Completed   R heel pain: achilles and plantar location.  Onset in past two weeks.    HTN: Patient reports good compliance with medication, good tolerance to medication, and good symptom control.   Dyslipidemia: Patient reports good compliance with medication, good tolerance to medication, and good symptom control.    R groin pain: onset 06/23/17; tylenol with relief; sore for one week; mild swelling for a few days.  Review of Systems  Constitutional: Negative for activity change, appetite change, chills, diaphoresis, fatigue, fever and unexpected weight change.  HENT: Negative for congestion, dental problem, drooling, ear discharge, ear pain, facial swelling, hearing loss, mouth sores, nosebleeds, postnasal drip, rhinorrhea, sinus pressure, sneezing, sore throat, tinnitus, trouble swallowing and voice change.   Eyes: Negative for photophobia, pain, discharge, redness, itching and visual disturbance.  Respiratory: Negative for apnea, cough, choking, chest tightness,  shortness of breath, wheezing and stridor.   Cardiovascular: Negative for chest pain, palpitations and leg swelling.  Gastrointestinal: Negative for abdominal distention, abdominal pain, anal bleeding, blood in stool, constipation, diarrhea, nausea, rectal pain and vomiting.  Endocrine: Negative for cold intolerance, heat intolerance, polydipsia, polyphagia and polyuria.  Genitourinary: Negative for decreased urine volume, difficulty urinating, discharge, dysuria, enuresis, flank pain, frequency, genital sores, hematuria, penile pain, penile swelling, scrotal swelling, testicular pain and urgency.       Nocturia x 0.  Urinary stream is strong.  Musculoskeletal: Positive for arthralgias. Negative for back pain, gait problem, joint swelling, myalgias, neck pain and neck stiffness.  Skin: Negative for color change, pallor, rash and wound.  Allergic/Immunologic: Negative for environmental allergies, food allergies and immunocompromised state.  Neurological: Negative for dizziness, tremors, seizures, syncope, facial asymmetry, speech difficulty, weakness, light-headedness, numbness and headaches.  Hematological: Negative for adenopathy. Does not bruise/bleed easily.  Psychiatric/Behavioral: Negative for agitation, behavioral problems, confusion, decreased concentration, dysphoric mood, hallucinations, self-injury, sleep disturbance and suicidal ideas. The patient is not nervous/anxious and is not hyperactive.        Bedtime 10-11; wakes up 430.    Past Medical History:  Diagnosis Date  . Arthritis    knees  . Erectile dysfunction    Cialis PRN  . GERD (gastroesophageal reflux disease)   . Glucose intolerance (impaired glucose tolerance)   . Hyperlipidemia   . Hypertension   . Hypogonadism male 08/23/2011   s/p urology consult [facility]; no treatment indicated due to potential of pregnancy.  . Mild sleep apnea 2009  NEVER WAS GIVEN CPAP PER PT  . Multinodular goiter 07/23/2007   thyroid  u/s: multinodular goiter; s/p ENT consult/[facility]:  Rx for Synthroid.  . Pain in joint, site unspecified   . Renal insufficiency    CYST ON KIDNEYS  . Thyroid disease    per pt low thyroid  . Tobacco use disorder   . Unspecified disorder of skin and subcutaneous tissue   . Unspecified hypothyroidism    Past Surgical History:  Procedure Laterality Date  . CARDIAC CATHETERIZATION  07/23/1999   negative.  . CHOLECYSTECTOMY  2003  . excision nosdule   L shouldher Left 2 2014  . INSERTION OF MESH N/A 02/27/2016   Procedure: INSERTION OF MESH;  Surgeon: Christene Lye, MD;  Location: [facility] ORS;  Service: General;  Laterality: N/A;  . pilonydal cyst  1981  . Sleep Study  07/23/2007  . VENTRAL HERNIA REPAIR N/A 02/27/2016   Procedure: HERNIA REPAIR VENTRAL ADULT;  Surgeon: Christene Lye, MD;  Location: [facility] ORS;  Service: General;  Laterality: N/A;   Allergies  Allergen Reactions  . Penicillins Rash    Has patient had a PCN reaction causing immediate rash, facial/tongue/throat swelling, SOB or lightheadedness with hypotension: rash Has patient had a PCN reaction causing severe rash involving mucus membranes or skin necrosis: no Has patient had a PCN reaction that required hospitalization no Has patient had a PCN reaction occurring within the last 10 years:no If all of the above answers are "NO", then may proceed with Cephalosporin use.   Current Outpatient Medications on File Prior to Visit  Medication Sig Dispense Refill  . aspirin 81 MG tablet Take 81 mg by mouth as needed.     . Multiple Vitamin (MULTIVITAMIN WITH MINERALS) TABS tablet Take 1 tablet by mouth daily.     No current facility-administered medications on file prior to visit.    Social History   Socioeconomic History  . Marital status: Single    Spouse name: Not on file  . Number of children: 0  . Years of education: Not on file  . Highest education level: Not on file  Social Needs  . Financial resource  strain: Not on file  . Food insecurity - worry: Not on file  . Food insecurity - inability: Not on file  . Transportation needs - medical: Not on file  . Transportation needs - non-medical: Not on file  Occupational History  . Occupation: Cytogeneticist, loads parts    Comment: x 24 years  for  [employer]  Tobacco Use  . Smoking status: Never Smoker  . Smokeless tobacco: Current User    Types: Chew  . Tobacco comment: PATIENT CHEWS TOBACCO  20 years  Substance and Sexual Activity  . Alcohol use: Yes    Comment:  5 TIMES/YEAR - [employer]  . Drug use: No  . Sexual activity: Yes  Other Topics Concern  . Not on file  Social History Narrative   Marital status: single; girlfriend passed in 02/2013 of breast cancer age 16.  Not dating in 2019.        Children: none      Lives: with father; mother passed away Dec 15, 2013.      Employment:  Works at [employer] in [employer] x 30 years; happy      Tobacco:  Chews tobacco x 28 years      Alcohol:  5 times per year at [employer]      Drugs:  None  Exercise: none in 2019.      Seatbelt:  50% of time; no texting      Guns: loaded secured guns in home.       Sexual activity: sexually active; no STDs; total sexual partners < 10.        Smoke alarm and carbon monoxide detector in the home.      Caffeine use: carbonated beverages, moderate amount.   Family History  Problem Relation Age of Onset  . Arthritis Mother   . Hypertension Mother   . COPD Mother   . Depression Mother   . Fibromyalgia Mother   . Gout Mother   . Kidney disease Mother   . Cancer Mother        lung  . Heart disease Mother 79       AMI s/p stenting  . Hypertension Father   . Benign prostatic hyperplasia Father   . Rosacea Father   . Arthritis Father   . Arthritis Brother   . Benign prostatic hyperplasia Brother   . Heart disease Maternal Grandfather   . Hypertension Paternal Grandmother   . Stroke Paternal Grandfather   . Diabetes Unknown   . Colon cancer  Neg Hx        Objective:    BP 136/88 (BP Location: Right Arm, Patient Position: Sitting, Cuff Size: Large)   Pulse 63   Temp 97.7 F (36.5 C) (Oral)   Resp 18   Ht 6' 1.66" (1.871 m)   Wt 226 lb 9.6 oz (102.8 kg)   SpO2 96%   BMI 29.36 kg/m  Physical Exam  Constitutional: He is oriented to person, place, and time. He appears well-developed and well-nourished. No distress.  HENT:  Head: Normocephalic and atraumatic.  Right Ear: External ear normal.  Left Ear: External ear normal.  Nose: Nose normal.  Mouth/Throat: Oropharynx is clear and moist.  Eyes: Conjunctivae and EOM are normal. Pupils are equal, round, and reactive to light.  Neck: Normal range of motion. Neck supple. Carotid bruit is not present. No thyromegaly present.  Cardiovascular: Normal rate, regular rhythm, normal heart sounds and intact distal pulses. Exam reveals no gallop and no friction rub.  No murmur heard. Pulmonary/Chest: Effort normal and breath sounds normal. He has no wheezes. He has no rales.  Abdominal: Soft. Bowel sounds are normal. He exhibits no distension and no mass. There is no tenderness. There is no rebound and no guarding. Hernia confirmed negative in the right inguinal area and confirmed negative in the left inguinal area.  Genitourinary: Testes normal and penis normal. Right testis shows no mass and no tenderness. Left testis shows no mass and no tenderness.  Musculoskeletal:       Right shoulder: Normal.       Left shoulder: Normal.       Right ankle: Normal. He exhibits normal range of motion and no swelling. No tenderness. No lateral malleolus and no medial malleolus tenderness found. Achilles tendon exhibits no pain and no defect.       Cervical back: Normal.  R FOOT: +TTP HEEL.  Lymphadenopathy:    He has no cervical adenopathy.       Right: No inguinal adenopathy present.       Left: No inguinal adenopathy present.  Neurological: He is alert and oriented to person, place, and time.  He has normal reflexes. No cranial nerve deficit. He exhibits normal muscle tone. Coordination normal.  Skin: Skin is warm and dry. No rash noted. He  is not diaphoretic.  Psychiatric: He has a normal mood and affect. His behavior is normal. Judgment and thought content normal.  Nursing note and vitals reviewed.  No results found. Depression screen [facility] 2/9 08/02/2017 02/04/2017 08/06/2016 07/23/2016 06/25/2016  Decreased Interest 0 0 0 0 0  Down, Depressed, Hopeless 0 0 0 0 0  PHQ - 2 Score 0 0 0 0 0   Fall Risk  08/02/2017 02/04/2017 08/06/2016 07/23/2016 06/25/2016  Falls in the past year? No Yes Yes Yes No  Number falls in past yr: - 1 - - -  Injury with Fall? - Yes No - -        Assessment & Plan:   1. Routine physical examination   2. Glucose intolerance   3. Dyslipidemia   4. Screening for prostate cancer   5. Hypothyroidism due to acquired atrophy of thyroid   6. Need for Tdap vaccination   7. Essential hypertension, benign   8. Gastroesophageal reflux disease without esophagitis   9. Erectile dysfunction, unspecified erectile dysfunction type   10. Plantar fasciitis of right foot     -anticipatory guidance provided --- exercise, weight loss, safe driving practices, aspirin 81mg  daily. -obtain age appropriate screening labs and labs for chronic disease management. -New onset plantar fasciitis.  Treat with Voltaren gel, icing every night, supportive shoes.Marland Kitchen  He will was stabilizing brace provided during visit.  If no improvement in 1 month, contact orthopedics for consultation. -New onset right groin pain 1 month ago with testicular pain.  No associated urinary symptoms.  No evidence of hernia on exam today.  Consistent with groin strain.    Orders Placed This Encounter  Procedures  . Tdap vaccine greater than or equal to 7yo IM  . CBC with Differential/Platelet  . Comprehensive metabolic panel    Order Specific Question:   Has the patient fasted?    Answer:   No  . Hemoglobin  A1c  . PSA  . TSH  . Lipid panel    Order Specific Question:   Has the patient fasted?    Answer:   No  . T4, free  . Apply other splint    R heel/ankle splint for plantar fasciitis  . POCT urinalysis dipstick   Meds ordered this encounter  Medications  . fenofibrate 160 MG tablet    Sig: Take 1 tablet (160 mg total) by mouth daily.    Dispense:  90 tablet    Refill:  3  . hydrochlorothiazide (HYDRODIURIL) 12.5 MG tablet    Sig: Take 1 tablet (12.5 mg total) by mouth daily.    Dispense:  90 tablet    Refill:  3  . irbesartan (AVAPRO) 300 MG tablet    Sig: TAKE 1 TABLET (300 MG TOTAL) BY MOUTH DAILY.    Dispense:  90 tablet    Refill:  1  . levothyroxine (SYNTHROID, LEVOTHROID) 125 MCG tablet    Sig: Take 1 tablet (125 mcg total) by mouth daily before breakfast.    Dispense:  90 tablet    Refill:  3  . pantoprazole (PROTONIX) 40 MG tablet    Sig: Take 1 tablet (40 mg total) by mouth daily.    Dispense:  90 tablet    Refill:  3  . tadalafil (CIALIS) 20 MG tablet    Sig: Take 1 tablet (20 mg total) by mouth daily as needed.    Dispense:  8 tablet    Refill:  11  . diclofenac sodium (VOLTAREN)  1 % GEL    Sig: Apply 2 g topically 4 (four) times daily.    Dispense:  100 g    Refill:  1    Return in about 6 months (around 01/30/2018) for follow-up chronic medical conditions.   Kristi Elayne Guerin, M.D. Primary Care at [facility] previously [facility] [place] [address] [place], [place]  [address] [phone] phone [phone] fax

## 2017-08-02 NOTE — Patient Instructions (Addendum)
IF you received an x-ray today, you will receive an invoice from [facility]. Please contact [facility] at [phone] with questions or concerns regarding your invoice.   IF you received labwork today, you will receive an invoice from [facility]. Please contact [facility] at [phone] with questions or concerns regarding your invoice.   Our billing staff will not be able to assist you with questions regarding bills from these companies.  You will be contacted with the lab results as soon as they are available. The fastest way to get your results is to activate your My Chart account. Instructions are located on the last [place] of this paperwork. If you have not heard from [place] regarding the results in 2 weeks, please contact this office.      Plantar Fasciitis Rehab Ask your health care provider which exercises are safe for you. Do exercises exactly as told by your health care provider and adjust them as directed. It is normal to feel mild stretching, pulling, tightness, or discomfort as you do these exercises, but you should stop right away if you feel sudden pain or your pain gets worse. Do not begin these exercises until told by your health care provider. Stretching and range of motion exercises These exercises warm up your muscles and joints and improve the movement and flexibility of your foot. These exercises also help to relieve pain. Exercise A: Plantar fascia stretch  1. Sit with your left / right leg crossed over your opposite knee. 2. Hold your heel with one hand with that thumb near your arch. With your other hand, hold your toes and gently pull them back toward the top of your foot. You should feel a stretch on the bottom of your toes or your foot or both. 3. Hold this stretch for__________ seconds. 4. Slowly release your toes and return to the starting position. Repeat __________ times. Complete this exercise __________ times a day. Exercise B: Gastroc,  standing  1. Stand with your hands against a wall. 2. Extend your left / right leg behind you, and bend your front knee slightly. 3. Keeping your heels on the floor and keeping your back knee straight, shift your weight toward the wall without arching your back. You should feel a gentle stretch in your left / right calf. 4. Hold this position for __________ seconds. Repeat __________ times. Complete this exercise __________ times a day. Exercise C: Soleus, standing 1. Stand with your hands against a wall. 2. Extend your left / right leg behind you, and bend your front knee slightly. 3. Keeping your heels on the floor, bend your back knee and slightly shift your weight over the back leg. You should feel a gentle stretch deep in your calf. 4. Hold this position for __________ seconds. Repeat __________ times. Complete this exercise __________ times a day. Exercise D: Gastrocsoleus, standing 1. Stand with the ball of your left / right foot on a step. The ball of your foot is on the walking surface, right under your toes. 2. Keep your other foot firmly on the same step. 3. Hold onto the wall or a railing for balance. 4. Slowly lift your other foot, allowing your body weight to press your heel down over the edge of the step. You should feel a stretch in your left / right calf. 5. Hold this position for __________ seconds. 6. Return both feet to the step. 7. Repeat this exercise with a slight bend in your left / right knee. Repeat  __________ times with your left / right knee straight and __________ times with your left / right knee bent. Complete this exercise __________ times a day. Balance exercise This exercise builds your balance and strength control of your arch to help take pressure off your plantar fascia. Exercise E: Single leg stand 1. Without shoes, stand near a railing or in a doorway. You may hold onto the railing or door frame as needed. 2. Stand on your left / right foot. Keep your  big toe down on the floor and try to keep your arch lifted. Do not let your foot roll inward. 3. Hold this position for __________ seconds. 4. If this exercise is too easy, you can try it with your eyes closed or while standing on a pillow. Repeat __________ times. Complete this exercise __________ times a day. This information is not intended to replace advice given to you by your health care provider. Make sure you discuss any questions you have with your health care provider. Document Released: 07/08/2005 Document Revised: 03/12/2016 Document Reviewed: 05/22/2015 Elsevier Interactive Patient Education  2018 [place] Years, Male Preventive care refers to lifestyle choices and visits with your health care provider that can promote health and wellness. What does preventive care include?  A yearly physical exam. This is also called an annual well check.  Dental exams once or twice a year.  Routine eye exams. Ask your health care provider how often you should have your eyes checked.  Personal lifestyle choices, including: ? Daily care of your teeth and gums. ? Regular physical activity. ? Eating a healthy diet. ? Avoiding tobacco and drug use. ? Limiting alcohol use. ? Practicing safe sex. ? Taking low-dose aspirin every day starting at age 51. What happens during an annual well check? The services and screenings done by your health care provider during your annual well check will depend on your age, overall health, lifestyle risk factors, and family history of disease. Counseling Your health care provider may ask you questions about your:  Alcohol use.  Tobacco use.  Drug use.  Emotional well-being.  Home and relationship well-being.  Sexual activity.  Eating habits.  Work and work Statistician.  Screening You may have the following tests or measurements:  Height, weight, and BMI.  Blood pressure.  Lipid and cholesterol levels. These  may be checked every 5 years, or more frequently if you are over 42 years old.  Skin check.  Lung cancer screening. You may have this screening every year starting at age 48 if you have a 30-pack-year history of smoking and currently smoke or have quit within the past 15 years.  Fecal occult blood test (FOBT) of the stool. You may have this test every year starting at age 26.  Flexible sigmoidoscopy or colonoscopy. You may have a sigmoidoscopy every 5 years or a colonoscopy every 10 years starting at age 56.  Prostate cancer screening. Recommendations will vary depending on your family history and other risks.  Hepatitis C blood test.  Hepatitis B blood test.  Sexually transmitted disease (STD) testing.  Diabetes screening. This is done by checking your blood sugar (glucose) after you have not eaten for a while (fasting). You may have this done every 1-3 years.  Discuss your test results, treatment options, and if necessary, the need for more tests with your health care provider. Vaccines Your health care provider may recommend certain vaccines, such as:  Influenza vaccine. This is recommended every year.  Tetanus, diphtheria, and acellular pertussis (Tdap, Td) vaccine. You may need a Td booster every 10 years.  Varicella vaccine. You may need this if you have not been vaccinated.  Zoster vaccine. You may need this after age 61.  Measles, mumps, and rubella (MMR) vaccine. You may need at least one dose of MMR if you were born in 1957 or later. You may also need a second dose.  Pneumococcal 13-valent conjugate (PCV13) vaccine. You may need this if you have certain conditions and have not been vaccinated.  Pneumococcal polysaccharide (PPSV23) vaccine. You may need one or two doses if you smoke cigarettes or if you have certain conditions.  Meningococcal vaccine. You may need this if you have certain conditions.  Hepatitis A vaccine. You may need this if you have certain  conditions or if you travel or work in places where you may be exposed to hepatitis A.  Hepatitis B vaccine. You may need this if you have certain conditions or if you travel or work in places where you may be exposed to hepatitis B.  Haemophilus influenzae type b (Hib) vaccine. You may need this if you have certain risk factors.  Talk to your health care provider about which screenings and vaccines you need and how often you need them. This information is not intended to replace advice given to you by your health care provider. Make sure you discuss any questions you have with your health care provider. Document Released: 08/04/2015 Document Revised: 03/27/2016 Document Reviewed: 05/09/2015 Elsevier Interactive Patient Education  [employer].

## 2017-08-03 LAB — COMPREHENSIVE METABOLIC PANEL
A/G RATIO: 2.5 — AB (ref 1.2–2.2)
ALT: 39 IU/L (ref 0–44)
AST: 24 IU/L (ref 0–40)
Albumin: 4.9 g/dL (ref 3.5–5.5)
Alkaline Phosphatase: 38 IU/L — ABNORMAL LOW (ref 39–117)
BUN/Creatinine Ratio: 12 (ref 9–20)
BUN: 16 mg/dL (ref 6–24)
Bilirubin Total: 0.5 mg/dL (ref 0.0–1.2)
CALCIUM: 9.5 mg/dL (ref 8.7–10.2)
CO2: 20 mmol/L (ref 20–29)
Chloride: 104 mmol/L (ref 96–106)
Creatinine, Ser: 1.29 mg/dL — ABNORMAL HIGH (ref 0.76–1.27)
GFR, EST AFRICAN AMERICAN: 72 mL/min/{1.73_m2} (ref >59)
GFR, EST NON AFRICAN AMERICAN: 62 mL/min/{1.73_m2} (ref >59)
Globulin, Total: 2 g/dL (ref 1.5–4.5)
Glucose: 115 mg/dL — ABNORMAL HIGH (ref 65–99)
POTASSIUM: 4.6 mmol/L (ref 3.5–5.2)
Sodium: 139 mmol/L (ref 134–144)
Total Protein: 6.9 g/dL (ref 6.0–8.5)

## 2017-08-03 LAB — CBC WITH DIFFERENTIAL/PLATELET
BASOS: 1 %
Basophils Absolute: 0 10*3/uL (ref 0.0–0.2)
EOS (ABSOLUTE): 0.3 10*3/uL (ref 0.0–0.4)
EOS: 6 %
HEMATOCRIT: 43.5 % (ref 37.5–51.0)
Hemoglobin: 14.8 g/dL (ref 13.0–17.7)
IMMATURE GRANULOCYTES: 0 %
Immature Grans (Abs): 0 10*3/uL (ref 0.0–0.1)
LYMPHS ABS: 1.2 10*3/uL (ref 0.7–3.1)
Lymphs: 22 %
MCH: 30.3 pg (ref 26.6–33.0)
MCHC: 34 g/dL (ref 31.5–35.7)
MCV: 89 fL (ref 79–97)
MONOS ABS: 0.4 10*3/uL (ref 0.1–0.9)
Monocytes: 8 %
NEUTROS ABS: 3.5 10*3/uL (ref 1.4–7.0)
NEUTROS PCT: 63 %
Platelets: 288 10*3/uL (ref 150–379)
RBC: 4.88 x10E6/uL (ref 4.14–5.80)
RDW: 13.8 % (ref 12.3–15.4)
WBC: 5.6 10*3/uL (ref 3.4–10.8)

## 2017-08-03 LAB — LIPID PANEL
CHOL/HDL RATIO: 5.2 ratio — AB (ref 0.0–5.0)
Cholesterol, Total: 146 mg/dL (ref 100–199)
HDL: 28 mg/dL — AB (ref >39)
LDL CALC: 93 mg/dL (ref 0–99)
TRIGLYCERIDES: 123 mg/dL (ref 0–149)
VLDL Cholesterol Cal: 25 mg/dL (ref 5–40)

## 2017-08-03 LAB — TSH: TSH: 1.21 u[IU]/mL (ref 0.450–4.500)

## 2017-08-03 LAB — HEMOGLOBIN A1C
ESTIMATED AVERAGE GLUCOSE: 128 mg/dL
Hgb A1c MFr Bld: 6.1 % — ABNORMAL HIGH (ref 4.8–5.6)

## 2017-08-03 LAB — T4, FREE: Free T4: 1.52 ng/dL (ref 0.82–1.77)

## 2017-08-03 LAB — PSA: Prostate Specific Ag, Serum: 2.8 ng/mL (ref 0.0–4.0)

## 2017-08-04 ENCOUNTER — Encounter: Payer: 59 | Admitting: Family Medicine

## 2017-08-13 ENCOUNTER — Encounter: Payer: 59 | Admitting: Family Medicine

## 2017-08-19 ENCOUNTER — Encounter: Payer: 59 | Admitting: Family Medicine

## 2017-08-26 ENCOUNTER — Encounter: Payer: 59 | Admitting: Family Medicine

## 2017-09-29 ENCOUNTER — Ambulatory Visit (INDEPENDENT_AMBULATORY_CARE_PROVIDER_SITE_OTHER): Payer: 59 | Admitting: Orthopaedic Surgery

## 2017-09-29 ENCOUNTER — Encounter (INDEPENDENT_AMBULATORY_CARE_PROVIDER_SITE_OTHER): Payer: Self-pay | Admitting: Orthopaedic Surgery

## 2017-09-29 DIAGNOSIS — M1711 Unilateral primary osteoarthritis, right knee: Secondary | ICD-10-CM

## 2017-09-29 MED ORDER — METHYLPREDNISOLONE ACETATE 40 MG/ML IJ SUSP
40.0000 mg | INTRAMUSCULAR | Status: AC | PRN
  Administered 2017-09-29: 40 mg via INTRA_ARTICULAR

## 2017-09-29 MED ORDER — BUPIVACAINE HCL 0.25 % IJ SOLN
2.0000 mL | INTRAMUSCULAR | Status: AC | PRN
  Administered 2017-09-29: 2 mL via INTRA_ARTICULAR

## 2017-09-29 MED ORDER — LIDOCAINE HCL 1 % IJ SOLN
2.0000 mL | INTRAMUSCULAR | Status: AC | PRN
  Administered 2017-09-29: 2 mL

## 2017-09-29 NOTE — Progress Notes (Signed)
Office Visit Note   Patient: Francisco Gallagher           Date of Birth: [birth year]           MRN: [id number] Visit Date: 09/29/2017              Requested by: Wardell Honour, MD [address] [place], [place] [address] PCP: Wardell Honour, MD   Assessment & Plan: Visit Diagnoses:  1. Primary osteoarthritis of right knee     Plan: Impression is right knee primary localized osteoarthritis.  Today, we reinjected his right knee with cortisone.  We are hopeful this will continue to help.  He will follow-up with [place] on an as-needed basis.  He will call with questions or concerns in the meantime.  Follow-Up Instructions: Return if symptoms worsen or fail to improve.   Orders:  Orders Placed This Encounter  Procedures  . Large Joint Inj: R knee   No orders of the defined types were placed in this encounter.     Procedures: Large Joint Inj: R knee on 09/29/2017 3:46 PM Indications: pain Details: 22 G needle, anterolateral approach Medications: 2 mL lidocaine 1 %; 2 mL bupivacaine 0.25 %; 40 mg methylPREDNISolone acetate 40 MG/ML      Clinical Data: No additional findings.   Subjective: Chief Complaint  Patient presents with  . Right Knee - Pain    HPI [name] is a pleasant 57 year old gentleman who presents to our clinic today with recurrent right knee pain.  He has a history of osteoarthritis to the right knee and has had this injected with cortisone over a year ago.  The injection has helped until recently.  His pain is located to the lateral aspect.  He describes this is a stiff throb worse at the end of the day.  No locking or catching.  He is tried Tylenol with moderate relief of symptoms.  Review of Systems as detailed in HPI.  All others are negative.   Objective: Vital Signs: There were no vitals taken for this visit.  Physical Exam well-developed well-nourished gentleman in no acute distress.  Alert and oriented x3.  Ortho Exam examination of his right knee  reveals no effusion.  Range of motion 0-125 degrees.  Minimal lateral joint line tenderness.  Moderate patellofemoral crepitus.  He is stable to valgus varus stress.  He is rest intact distally.  Specialty Comments:  No specialty comments available.  Imaging:  no new imaging today.   PMFS History: Patient Active Problem List   Diagnosis Date Noted  . Primary osteoarthritis of right knee 07/08/2016  . Renal insufficiency 02/06/2015  . Glucose intolerance (impaired glucose tolerance) 02/16/2014  . Essential hypertension, benign 08/03/2012  . Hyperlipidemia 08/03/2012  . Hypothyroidism 08/03/2012  . GERD (gastroesophageal reflux disease) 08/03/2012  . Erectile dysfunction 08/03/2012  . Hypogonadism male 08/03/2012   Past Medical History:  Diagnosis Date  . Arthritis    knees  . Erectile dysfunction    Cialis PRN  . GERD (gastroesophageal reflux disease)   . Glucose intolerance (impaired glucose tolerance)   . Hyperlipidemia   . Hypertension   . Hypogonadism male 08/23/2011   s/p urology consult [facility]; no treatment indicated due to potential of pregnancy.  . Mild sleep apnea 2009   NEVER WAS GIVEN CPAP PER PT  . Multinodular goiter 07/23/2007   thyroid u/s: multinodular goiter; s/p ENT consult/[facility]:  Rx for Synthroid.  . Pain in joint, site unspecified   .  Renal insufficiency    CYST ON KIDNEYS  . Thyroid disease    per pt low thyroid  . Tobacco use disorder   . Unspecified disorder of skin and subcutaneous tissue   . Unspecified hypothyroidism     Family History  Problem Relation Age of Onset  . Arthritis Mother   . Hypertension Mother   . COPD Mother   . Depression Mother   . Fibromyalgia Mother   . Gout Mother   . Kidney disease Mother   . Cancer Mother        lung  . Heart disease Mother 56       AMI s/p stenting  . Hypertension Father   . Benign prostatic hyperplasia Father   . Rosacea Father   . Arthritis Father   . Arthritis Brother   .  Benign prostatic hyperplasia Brother   . Heart disease Maternal Grandfather   . Hypertension Paternal Grandmother   . Stroke Paternal Grandfather   . Diabetes Unknown   . Colon cancer Neg Hx     Past Surgical History:  Procedure Laterality Date  . CARDIAC CATHETERIZATION  07/23/1999   negative.  . CHOLECYSTECTOMY  2003  . excision nosdule   L shouldher Left 2 2014  . INSERTION OF MESH N/A 02/27/2016   Procedure: INSERTION OF MESH;  Surgeon: Christene Lye, MD;  Location: [facility] ORS;  Service: General;  Laterality: N/A;  . pilonydal cyst  1981  . Sleep Study  07/23/2007  . VENTRAL HERNIA REPAIR N/A 02/27/2016   Procedure: HERNIA REPAIR VENTRAL ADULT;  Surgeon: Christene Lye, MD;  Location: [facility] ORS;  Service: General;  Laterality: N/A;   Social History   Occupational History  . Occupation: Cytogeneticist, loads parts    Comment: x 24 years  for  [employer]  Tobacco Use  . Smoking status: Never Smoker  . Smokeless tobacco: Current User    Types: Chew  . Tobacco comment: PATIENT CHEWS TOBACCO  20 years  Substance and Sexual Activity  . Alcohol use: Yes    Comment:  5 TIMES/YEAR - [employer]  . Drug use: No  . Sexual activity: Yes

## 2017-12-15 ENCOUNTER — Encounter: Payer: Self-pay | Admitting: Family Medicine

## 2018-02-01 ENCOUNTER — Other Ambulatory Visit: Payer: Self-pay | Admitting: Family Medicine

## 2018-02-01 DIAGNOSIS — I10 Essential (primary) hypertension: Secondary | ICD-10-CM

## 2018-02-02 ENCOUNTER — Encounter: Payer: Self-pay | Admitting: Family Medicine

## 2018-02-02 ENCOUNTER — Ambulatory Visit (INDEPENDENT_AMBULATORY_CARE_PROVIDER_SITE_OTHER): Payer: 59 | Admitting: Family Medicine

## 2018-02-02 ENCOUNTER — Other Ambulatory Visit: Payer: Self-pay

## 2018-02-02 VITALS — BP 118/82 | HR 67 | Temp 98.0°F | Resp 16 | Ht 73.62 in | Wt 218.0 lb

## 2018-02-02 DIAGNOSIS — K219 Gastro-esophageal reflux disease without esophagitis: Secondary | ICD-10-CM

## 2018-02-02 DIAGNOSIS — E034 Atrophy of thyroid (acquired): Secondary | ICD-10-CM | POA: Diagnosis not present

## 2018-02-02 DIAGNOSIS — I10 Essential (primary) hypertension: Secondary | ICD-10-CM

## 2018-02-02 DIAGNOSIS — E7800 Pure hypercholesterolemia, unspecified: Secondary | ICD-10-CM

## 2018-02-02 DIAGNOSIS — R0609 Other forms of dyspnea: Secondary | ICD-10-CM | POA: Diagnosis not present

## 2018-02-02 DIAGNOSIS — R7302 Impaired glucose tolerance (oral): Secondary | ICD-10-CM

## 2018-02-02 DIAGNOSIS — N289 Disorder of kidney and ureter, unspecified: Secondary | ICD-10-CM

## 2018-02-02 MED ORDER — IRBESARTAN 300 MG PO TABS: ORAL_TABLET | ORAL | 1 refills | Status: DC

## 2018-02-02 MED ORDER — DICLOFENAC SODIUM 1 % TD GEL: 2.0000 g | Freq: Four times a day (QID) | TRANSDERMAL | 1 refills | Status: AC

## 2018-02-02 NOTE — Telephone Encounter (Signed)
Appointment scheduled for today. Preview refill for irbesartan 300 mg tab.  Zannie Cove, MD  [facility] [phone]

## 2018-02-02 NOTE — Patient Instructions (Signed)
     IF you received an x-ray today, you will receive an invoice from [facility]. Please contact [facility] at [phone] with questions or concerns regarding your invoice.   IF you received labwork today, you will receive an invoice from [facility]. Please contact [facility] at [phone] with questions or concerns regarding your invoice.   Our billing staff will not be able to assist you with questions regarding bills from these companies.  You will be contacted with the lab results as soon as they are available. The fastest way to get your results is to activate your My Chart account. Instructions are located on the last [place] of this paperwork. If you have not heard from us regarding the results in 2 weeks, please contact this office.     

## 2018-02-02 NOTE — Progress Notes (Signed)
Subjective:    Patient ID: Francisco Gallagher, male    DOB: 1960-08-13, 57 y.o.   MRN: [id number]  02/02/2018  Chronic Conditions (6 month follow-up )    HPI This 57 y.o. male presents for six month follow-up of hypertension, glucose intolerance, hypercholesterolemia, GERD, hypothyroidism, renal insufficiency.  Management changes made at last visit include the following:  -obtain age appropriate screening labs and labs for chronic disease management. -New onset plantar fasciitis.  Treat with Voltaren gel, icing every night, supportive shoes.Marland Kitchen  He will was stabilizing brace provided during visit.  If no improvement in 1 month, contact orthopedics for consultation. -New onset right groin pain 1 month ago with testicular pain.  No associated urinary symptoms.  No evidence of hernia on exam today.  Consistent with groin strain. Liver function tests are normal-No evidence of anemia. -Sugar/glucose is elevated at 115 and hemoglobin A1c is also elevated which is consistent with prediabetic state.  I recommend weight loss, exercise, and low-carbohydrate low-sugar food choices. You should AVOID: regular sodas, sweetened tea, fruit juices. You should LIMIT: breads, pastas, rice, potatoes, and desserts/sweets. I would recommend limiting your total carbohydrate intake per meal to 45 grams; I would limit your total carbohydrate intake per snack to 30 grams. I would also have a goal of 60 grams of protein intake per day; this would equal 10-15 grams of protein per meal and 5-10 grams of protein per snack. -Creatinine remains slightly elevated at 1.29 but this is actually improved from your last visit. Continue to avoid anti-inflammatories including ibuprofen, Motrin, Advil, Aleve, naproxen. -Liver function tests are normal. -PSA is normal. -Thyroid function is normal. Continue current dose of medication. -Cholesterol is under good control. Continue current therapy. -Urine is normal.  UPDATE: R knee  is hurting. S/p injection in March 2019. No improvement.  No swelling; feeling like going to give out.  Unable to flex fully; can force full flexion.  No xray in two years.   Limping at work; using Diclofenac gel.   Always SOB; especially with humidity. Not exercising.  Can mow the yard without stopping and resting. Patient reports good compliance with medication, good tolerance to medication, and good symptom control.   Not checking blood pressure at home.  Admits to drinking more Coke and sweet tea with heat in July.  Did [place] with brother once since last visit.  Suffered with palpitations and shortness of breath.  Friends and family had similar symptoms.  Not exercising regularly.   BP Readings from Last 3 Encounters:  02/02/18 118/82  08/02/17 136/88  02/04/17 131/86   Wt Readings from Last 3 Encounters:  02/02/18 218 lb (98.9 kg)  08/02/17 226 lb 9.6 oz (102.8 kg)  02/04/17 222 lb (100.7 kg)   Immunization History  Administered Date(s) Administered  . Tdap 01/18/2008, 08/02/2017    Review of Systems  Constitutional: Negative for activity change, appetite change, chills, diaphoresis, fatigue, fever and unexpected weight change.  HENT: Negative for congestion, dental problem, drooling, ear discharge, ear pain, facial swelling, hearing loss, mouth sores, nosebleeds, postnasal drip, rhinorrhea, sinus pressure, sneezing, sore throat, tinnitus, trouble swallowing and voice change.   Eyes: Negative for photophobia, pain, discharge, redness, itching and visual disturbance.  Respiratory: Positive for shortness of breath. Negative for apnea, cough, choking, chest tightness, wheezing and stridor.   Cardiovascular: Negative for chest pain, palpitations and leg swelling.  Gastrointestinal: Negative for abdominal distention, abdominal pain, anal bleeding, blood in stool, constipation, diarrhea, nausea  and vomiting.  Endocrine: Negative for cold intolerance, heat  intolerance, polydipsia, polyphagia and polyuria.  Genitourinary: Negative for decreased urine volume, difficulty urinating, discharge, dysuria, enuresis, flank pain, frequency, genital sores, hematuria, penile pain, penile swelling, scrotal swelling, testicular pain and urgency.  Musculoskeletal: Positive for arthralgias. Negative for back pain, gait problem, joint swelling, myalgias, neck pain and neck stiffness.  Skin: Negative for color change, pallor, rash and wound.  Allergic/Immunologic: Negative for environmental allergies, food allergies and immunocompromised state.  Neurological: Negative for dizziness, tremors, seizures, syncope, facial asymmetry, speech difficulty, weakness, light-headedness, numbness and headaches.  Hematological: Negative for adenopathy. Does not bruise/bleed easily.  Psychiatric/Behavioral: Negative for agitation, behavioral problems, confusion, decreased concentration, dysphoric mood, hallucinations, self-injury, sleep disturbance and suicidal ideas. The patient is not nervous/anxious and is not hyperactive.     Past Medical History:  Diagnosis Date  . Arthritis    knees  . Erectile dysfunction    Cialis PRN  . GERD (gastroesophageal reflux disease)   . Glucose intolerance (impaired glucose tolerance)   . Hyperlipidemia   . Hypertension   . Hypogonadism male 08/23/2011   s/p urology consult [facility]; no treatment indicated due to potential of pregnancy.  . Mild sleep apnea 2009   NEVER WAS GIVEN CPAP PER PT  . Multinodular goiter 07/23/2007   thyroid u/s: multinodular goiter; s/p ENT consult/[facility]:  Rx for Synthroid.  . Pain in joint, site unspecified   . Renal insufficiency    CYST ON KIDNEYS  . Thyroid disease    per pt low thyroid  . Tobacco use disorder   . Unspecified disorder of skin and subcutaneous tissue   . Unspecified hypothyroidism    Past Surgical History:  Procedure Laterality Date  . CARDIAC CATHETERIZATION  07/23/1999    negative.  . CHOLECYSTECTOMY  2003  . excision nosdule   L shouldher Left 2 2014  . INSERTION OF MESH N/A 02/27/2016   Procedure: INSERTION OF MESH;  Surgeon: Christene Lye, MD;  Location: [facility] ORS;  Service: General;  Laterality: N/A;  . pilonydal cyst  1981  . Sleep Study  07/23/2007  . VENTRAL HERNIA REPAIR N/A 02/27/2016   Procedure: HERNIA REPAIR VENTRAL ADULT;  Surgeon: Christene Lye, MD;  Location: [facility] OR[facility];  Service: General;  Laterality: N/A;   Allergies  Allergen Reactions  . Penicillins Rash    Has patient had a PCN reaction causing immediate rash, facial/tongue/throat swelling, SOB or lightheadedness with hypotension: rash Has patient had a PCN reaction causing severe rash involving mucus membranes or skin necrosis: no Has patient had a PCN reaction that required hospitalization no Has patient had a PCN reaction occurring within the last 10 years:no If all of the above answers are "NO", then may proceed with Cephalosporin use.   Current Outpatient Medications on File Prior to Visit  Medication Sig Dispense Refill  . aspirin 81 MG tablet Take 81 mg by mouth as needed.     . fenofibrate 160 MG tablet Take 1 tablet (160 mg total) by mouth daily. 90 tablet 3  . hydrochlorothiazide (HYDRODIURIL) 12.5 MG tablet Take 1 tablet (12.5 mg total) by mouth daily. 90 tablet 3  . levothyroxine (SYNTHROID, LEVOTHROID) 125 MCG tablet Take 1 tablet (125 mcg total) by mouth daily before breakfast. 90 tablet 3  . Multiple Vitamin (MULTIVITAMIN WITH MINERALS) TABS tablet Take 1 tablet by mouth daily.    . pantoprazole (PROTONIX) 40 MG tablet Take 1 tablet (40 mg total) by mouth daily. [place]  tablet 3  . tadalafil (CIALIS) 20 MG tablet Take 1 tablet (20 mg total) by mouth daily as needed. 8 tablet 11   No current facility-administered medications on file prior to visit.    Social History   Socioeconomic History  . Marital status: Single    Spouse name: Not on file  . Number of  children: 0  . Years of education: Not on file  . Highest education level: Not on file  Occupational History  . Occupation: Cytogeneticist, loads parts    Comment: x 24 years  for  [employer]  . Financial resource strain: Not on file  . Food insecurity:    Worry: Not on file    Inability: Not on file  . Transportation needs:    Medical: Not on file    Non-medical: Not on file  Tobacco Use  . Smoking status: Never Smoker  . Smokeless tobacco: Current User    Types: Chew  . Tobacco comment: PATIENT CHEWS TOBACCO  20 years  Substance and Sexual Activity  . Alcohol use: Yes    Comment:  5 TIMES/YEAR - BEER AND LIQUOR  . Drug use: No  . Sexual activity: Yes  Lifestyle  . Physical activity:    Days per week: Not on file    Minutes per session: Not on file  . Stress: Not on file  Relationships  . Social connections:    Talks on phone: Not on file    Gets together: Not on file    Attends religious service: Not on file    Active member of club or organization: Not on file    Attends meetings of clubs or organizations: Not on file    Relationship status: Not on file  . Intimate partner violence:    Fear of current or ex partner: Not on file    Emotionally abused: Not on file    Physically abused: Not on file    Forced sexual activity: Not on file  Other Topics Concern  . Not on file  Social History Narrative   Marital status: single; girlfriend passed in 02/2013 of breast cancer age 27.  Not dating in 2019.        Children: none      Lives: with father; mother passed away 11-27-2013.      Employment:  Works at [employer] in [employer] x 30 years; happy      Tobacco:  Chews tobacco x 28 years      Alcohol:  5 times per year at [employer]      Drugs:  None      Exercise: none in 2019.      Seatbelt:  50% of time; no texting      Guns: loaded secured guns in home.       Sexual activity: sexually active; no STDs; total sexual partners < 10.        Smoke alarm and  carbon monoxide detector in the home.      Caffeine use: carbonated beverages, moderate amount.   Family History  Problem Relation Age of Onset  . Arthritis Mother   . Hypertension Mother   . COPD Mother   . Depression Mother   . Fibromyalgia Mother   . Gout Mother   . Kidney disease Mother   . Cancer Mother        lung  . Heart disease Mother 78       AMI s/p stenting  .  Hypertension Father   . Benign prostatic hyperplasia Father   . Rosacea Father   . Arthritis Father   . Arthritis Brother   . Benign prostatic hyperplasia Brother   . Heart disease Maternal Grandfather   . Hypertension Paternal Grandmother   . Stroke Paternal Grandfather   . Diabetes Unknown   . Colon cancer Neg Hx        Objective:    BP 118/82   Pulse 67   Temp 98 F (36.7 C) (Oral)   Resp 16   Ht 6' 1.62" (1.87 m)   Wt 218 lb (98.9 kg)   SpO2 95%   BMI 28.28 kg/m  Physical Exam  Constitutional: He is oriented to person, place, and time. He appears well-developed and well-nourished. No distress.  HENT:  Head: Normocephalic and atraumatic.  Right Ear: External ear normal.  Left Ear: External ear normal.  Nose: Nose normal.  Mouth/Throat: Oropharynx is clear and moist.  Eyes: Pupils are equal, round, and reactive to light. Conjunctivae and EOM are normal.  Neck: Normal range of motion. Neck supple. Carotid bruit is not present. No thyromegaly present.  Cardiovascular: Normal rate, regular rhythm, normal heart sounds and intact distal pulses. Exam reveals no gallop and no friction rub.  No murmur heard. Pulmonary/Chest: Effort normal and breath sounds normal. He has no wheezes. He has no rales.  Abdominal: Soft. Bowel sounds are normal. He exhibits no distension and no mass. There is no tenderness. There is no rebound and no guarding.  Musculoskeletal:       Right knee: He exhibits decreased range of motion. He exhibits no swelling and no effusion. No tenderness found. No medial joint line and  no lateral joint line tenderness noted.  Lymphadenopathy:    He has no cervical adenopathy.  Neurological: He is alert and oriented to person, place, and time. No cranial nerve deficit.  Skin: Skin is warm and dry. No rash noted. He is not diaphoretic.  Psychiatric: He has a normal mood and affect. His behavior is normal.  Nursing note and vitals reviewed.  No results found. Depression screen [facility] 2/9 02/02/2018 08/02/2017 02/04/2017 08/06/2016 07/23/2016  Decreased Interest 0 0 0 0 0  Down, Depressed, Hopeless 0 0 0 0 0  PHQ - 2 Score 0 0 0 0 0   Fall Risk  02/02/2018 08/02/2017 02/04/2017 08/06/2016 07/23/2016  Falls in the past year? No No Yes Yes Yes  Number falls in past yr: - - 1 - -  Injury with Fall? - - Yes No -        Assessment & Plan:   1. Dyspnea on exertion   2. Essential hypertension, benign   3. Hypothyroidism due to acquired atrophy of thyroid   4. Gastroesophageal reflux disease without esophagitis   5. Glucose intolerance (impaired glucose tolerance)   6. Renal insufficiency   7. Pure hypercholesterolemia     Dyspnea on exertion: New onset.  Obtain EKG.  Most consistent with deconditioning due to lack of regular exercise.  Obtain labs to rule out secondary causes.  If EKG normal, recommend initiating regular exercise regimen.  Glucose intolerance: Noncompliance with diet.  Obtain labs.  Highly encouraged a low sugar and low carbohydrate diet.  Osteoarthritis of right knee: Status post injection by orthopedics 3 months ago without improvement.  Recommend follow-up with orthopedics.  Continue with Voltaren gel as needed.  Also encouraged regular exercise.  Hypertension, hypercholesterolemia, hypothyroidism, GERD: Well controlled.  Obtain labs.  No changes  in therapy.  Orders Placed This Encounter  Procedures  . CBC with Differential/Platelet  . Comprehensive metabolic panel    Order Specific Question:   Has the patient fasted?    Answer:   No  . Lipid panel     Order Specific Question:   Has the patient fasted?    Answer:   No  . T4, free  . TSH  . Hemoglobin A1c  . Lipid panel  . EKG 12-Lead   Meds ordered this encounter  Medications  . diclofenac sodium (VOLTAREN) 1 % GEL    Sig: Apply 2 g topically 4 (four) times daily.    Dispense:  100 g    Refill:  1  . irbesartan (AVAPRO) 300 MG tablet    Sig: TAKE 1 TABLET (300 MG TOTAL) BY MOUTH DAILY.    Dispense:  90 tablet    Refill:  1    Return in about 6 months (around 08/05/2018) for complete physical examiniation [place].   Danaka Llera Elayne Guerin, M.D. Primary Care at [facility] previously [facility] [address] [place], [place]  [address] [phone] phone [phone] fax

## 2018-02-03 LAB — CBC WITH DIFFERENTIAL/PLATELET
BASOS: 1 %
Basophils Absolute: 0 10*3/uL (ref 0.0–0.2)
EOS (ABSOLUTE): 0.3 10*3/uL (ref 0.0–0.4)
Eos: 5 %
HEMOGLOBIN: 13.9 g/dL (ref 13.0–17.7)
Hematocrit: 39.7 % (ref 37.5–51.0)
IMMATURE GRANS (ABS): 0 10*3/uL (ref 0.0–0.1)
IMMATURE GRANULOCYTES: 0 %
LYMPHS: 24 %
Lymphocytes Absolute: 1.6 10*3/uL (ref 0.7–3.1)
MCH: 30.1 pg (ref 26.6–33.0)
MCHC: 35 g/dL (ref 31.5–35.7)
MCV: 86 fL (ref 79–97)
MONOCYTES: 7 %
Monocytes Absolute: 0.5 10*3/uL (ref 0.1–0.9)
NEUTROS PCT: 63 %
Neutrophils Absolute: 4.2 10*3/uL (ref 1.4–7.0)
Platelets: 262 10*3/uL (ref 150–450)
RBC: 4.62 x10E6/uL (ref 4.14–5.80)
RDW: 14.1 % (ref 12.3–15.4)
WBC: 6.6 10*3/uL (ref 3.4–10.8)

## 2018-02-03 LAB — TSH: TSH: 1.24 u[IU]/mL (ref 0.450–4.500)

## 2018-02-03 LAB — COMPREHENSIVE METABOLIC PANEL
ALT: 35 IU/L (ref 0–44)
AST: 22 IU/L (ref 0–40)
Albumin/Globulin Ratio: 2.2 (ref 1.2–2.2)
Albumin: 4.9 g/dL (ref 3.5–5.5)
Alkaline Phosphatase: 34 IU/L — ABNORMAL LOW (ref 39–117)
BILIRUBIN TOTAL: 0.3 mg/dL (ref 0.0–1.2)
BUN / CREAT RATIO: 16 (ref 9–20)
BUN: 21 mg/dL (ref 6–24)
CALCIUM: 9.9 mg/dL (ref 8.7–10.2)
CHLORIDE: 105 mmol/L (ref 96–106)
CO2: 18 mmol/L — ABNORMAL LOW (ref 20–29)
Creatinine, Ser: 1.35 mg/dL — ABNORMAL HIGH (ref 0.76–1.27)
GFR, EST AFRICAN AMERICAN: 68 mL/min/{1.73_m2} (ref >59)
GFR, EST NON AFRICAN AMERICAN: 59 mL/min/{1.73_m2} — AB (ref >59)
GLUCOSE: 79 mg/dL (ref 65–99)
Globulin, Total: 2.2 g/dL (ref 1.5–4.5)
Potassium: 4.5 mmol/L (ref 3.5–5.2)
Sodium: 142 mmol/L (ref 134–144)
TOTAL PROTEIN: 7.1 g/dL (ref 6.0–8.5)

## 2018-02-03 LAB — LIPID PANEL
Chol/HDL Ratio: 5.7 ratio — ABNORMAL HIGH (ref 0.0–5.0)
Cholesterol, Total: 148 mg/dL (ref 100–199)
HDL: 26 mg/dL — AB (ref >39)
LDL CALC: 98 mg/dL (ref 0–99)
TRIGLYCERIDES: 120 mg/dL (ref 0–149)
VLDL Cholesterol Cal: 24 mg/dL (ref 5–40)

## 2018-02-03 LAB — T4, FREE: Free T4: 1.45 ng/dL (ref 0.82–1.77)

## 2018-02-03 LAB — HEMOGLOBIN A1C
ESTIMATED AVERAGE GLUCOSE: 126 mg/dL
HEMOGLOBIN A1C: 6 % — AB (ref 4.8–5.6)

## 2018-03-16 ENCOUNTER — Telehealth: Payer: Self-pay | Admitting: Family Medicine

## 2018-03-16 NOTE — Telephone Encounter (Signed)
Copied from [place] [id number]. Topic: Quick Communication - See Telephone Encounter >> Mar 16, 2018  2:42 PM Hewitt Shorts wrote: Pt is calling to say that irbesartan is on back order and is needing something else called in as a replacement because they do no know when it will be available  Previous pt of [facility] Best number [phone]

## 2018-03-17 MED ORDER — LISINOPRIL 20 MG PO TABS: 20.0000 mg | ORAL_TABLET | Freq: Every day | ORAL | 1 refills | Status: DC

## 2018-03-17 NOTE — Telephone Encounter (Signed)
Please let patient know that I isent rx for lisinopril 20mg . I did not see it in allergy list nor that it has been prescribed before. He should come in for a visit after he has been on new med for about 2 weeks to make sure BP controlled and kidney function remains stable. Thanks I Pamella Pert, MD

## 2018-03-18 NOTE — Telephone Encounter (Signed)
Left message detailed for patient

## 2018-04-10 ENCOUNTER — Other Ambulatory Visit: Payer: Self-pay | Admitting: Family Medicine

## 2018-04-10 NOTE — Telephone Encounter (Signed)
Patient called and advised of the last note by Dr. Pamella Pert to come in for a visit 2 weeks after taking Lisinopril. He says that he has an appointment with Dr. Reginia Forts on Monday and he will talk to her about it then. He says he has enough pills to last.

## 2018-07-07 ENCOUNTER — Ambulatory Visit (INDEPENDENT_AMBULATORY_CARE_PROVIDER_SITE_OTHER): Payer: 59 | Admitting: Orthopaedic Surgery

## 2018-07-07 ENCOUNTER — Encounter (INDEPENDENT_AMBULATORY_CARE_PROVIDER_SITE_OTHER): Payer: Self-pay | Admitting: Orthopaedic Surgery

## 2018-07-07 ENCOUNTER — Ambulatory Visit (INDEPENDENT_AMBULATORY_CARE_PROVIDER_SITE_OTHER): Payer: Self-pay

## 2018-07-07 ENCOUNTER — Ambulatory Visit (INDEPENDENT_AMBULATORY_CARE_PROVIDER_SITE_OTHER): Payer: 59

## 2018-07-07 DIAGNOSIS — M1711 Unilateral primary osteoarthritis, right knee: Secondary | ICD-10-CM

## 2018-07-07 DIAGNOSIS — M25561 Pain in right knee: Secondary | ICD-10-CM

## 2018-07-07 DIAGNOSIS — M25562 Pain in left knee: Secondary | ICD-10-CM

## 2018-07-07 DIAGNOSIS — G8929 Other chronic pain: Secondary | ICD-10-CM | POA: Diagnosis not present

## 2018-07-07 MED ORDER — BUPIVACAINE HCL 0.5 % IJ SOLN
2.0000 mL | INTRAMUSCULAR | Status: AC | PRN
  Administered 2018-07-07: 2 mL via INTRA_ARTICULAR

## 2018-07-07 MED ORDER — LIDOCAINE HCL 1 % IJ SOLN
2.0000 mL | INTRAMUSCULAR | Status: AC | PRN
  Administered 2018-07-07: 2 mL

## 2018-07-07 MED ORDER — METHYLPREDNISOLONE ACETATE 40 MG/ML IJ SUSP
40.0000 mg | INTRAMUSCULAR | Status: AC | PRN
  Administered 2018-07-07: 40 mg via INTRA_ARTICULAR

## 2018-07-07 NOTE — Progress Notes (Signed)
Office Visit Note   Patient: Francisco Gallagher           Date of Birth: [birth year]           MRN: [id number] Visit Date: 07/07/2018              Requested by: No referring provider defined for this encounter. PCP: Wardell Honour, MD   Assessment & Plan: Visit Diagnoses:  1. Chronic pain of both knees     Plan: Bilateral knee pain secondary to mild-moderate osteoarthritis.  His right knee is more painful than the left.  Knees are stable on exam.  Steroid injection performed for the right knee today.  X-rays also obtained today which show degenerative changes but no acute bony abnormality.  Provided with sample of pennsaid, if this is better or equivalent than his voltaren, will order. He will follow-up as needed  Follow-Up Instructions: No follow-ups on file.   Orders:  Orders Placed This Encounter  Procedures  . XR KNEE 3 VIEW LEFT  . XR KNEE 3 VIEW RIGHT   No orders of the defined types were placed in this encounter.     Procedures: Large Joint Inj: R knee on 07/07/2018 5:27 PM Indications: pain Details: 22 G needle  Arthrogram: No  Medications: 40 mg methylPREDNISolone acetate 40 MG/ML; 2 mL lidocaine 1 %; 2 mL bupivacaine 0.5 % Consent was given by the patient. Patient was prepped and draped in the usual sterile fashion.       Clinical Data: No additional findings.   Subjective: Chief Complaint  Patient presents with  . Right Knee - Pain    HPI  57 year old male returns for bilateral knee pain.  Right significantly worse than the left.  He was last seen in March 2019.  He received a steroid injection at that time which he states helped for about 6 months.  He only recently began having worsening pain and stiffness at the end after work.  He uses Tylenol and Voltaren gel which provide him relief.  He denies any sniffing swelling, erythema, bruising.  No numbness or tingling.  No skin changes.  Review of Systems  See hpi   Objective: Vital Signs: There  were no vitals taken for this visit.  Physical Exam  Gen: awake, alert ,NAD Pulm: breathing unlabored   Ortho Exam  Right Knee: - Inspection: mild degenerative bony changes evident. No swelling/effusion, erythema or bruising. Skin intact - Palpation: medial and lateral joint like TTP - ROM: full active ROM with flexion and extension in knee - Strength: 5/5 strength - Neuro/vasc: NV intact - Special Tests: - LIGAMENTS: negative anterior and posterior drawer, negative Lachman's, no MCL or LCL laxity  -- MENISCUS: negative McMurray's   Left knee: No swelling Mild b/l joint line TTP Full ROM with 5/5 strength NV intact   Specialty Comments:  No specialty comments available.  Imaging: No results found.   PMFS History: Patient Active Problem List   Diagnosis Date Noted  . Primary osteoarthritis of right knee 07/08/2016  . Renal insufficiency 02/06/2015  . Glucose intolerance (impaired glucose tolerance) 02/16/2014  . Essential hypertension, benign 08/03/2012  . Hyperlipidemia 08/03/2012  . Hypothyroidism 08/03/2012  . GERD (gastroesophageal reflux disease) 08/03/2012  . Erectile dysfunction 08/03/2012  . Hypogonadism male 08/03/2012   Past Medical History:  Diagnosis Date  . Arthritis    knees  . Erectile dysfunction    Cialis PRN  . GERD (gastroesophageal reflux disease)   . Glucose  intolerance (impaired glucose tolerance)   . Hyperlipidemia   . Hypertension   . Hypogonadism male 08/23/2011   s/p urology consult [facility]; no treatment indicated due to potential of pregnancy.  . Mild sleep apnea 2009   NEVER WAS GIVEN CPAP PER PT  . Multinodular goiter 07/23/2007   thyroid u/s: multinodular goiter; s/p ENT consult/[facility]:  Rx for Synthroid.  . Pain in joint, site unspecified   . Renal insufficiency    CYST ON KIDNEYS  . Thyroid disease    per pt low thyroid  . Tobacco use disorder   . Unspecified disorder of skin and subcutaneous tissue   .  Unspecified hypothyroidism     Family History  Problem Relation Age of Onset  . Arthritis Mother   . Hypertension Mother   . COPD Mother   . Depression Mother   . Fibromyalgia Mother   . Gout Mother   . Kidney disease Mother   . Cancer Mother        lung  . Heart disease Mother 96       AMI s/p stenting  . Hypertension Father   . Benign prostatic hyperplasia Father   . Rosacea Father   . Arthritis Father   . Arthritis Brother   . Benign prostatic hyperplasia Brother   . Heart disease Maternal Grandfather   . Hypertension Paternal Grandmother   . Stroke Paternal Grandfather   . Diabetes Unknown   . Colon cancer Neg Hx     Past Surgical History:  Procedure Laterality Date  . CARDIAC CATHETERIZATION  07/23/1999   negative.  . CHOLECYSTECTOMY  2003  . excision nosdule   L shouldher Left 2 2014  . INSERTION OF MESH N/A 02/27/2016   Procedure: INSERTION OF MESH;  Surgeon: Christene Lye, MD;  Location: [facility];  Service: General;  Laterality: N/A;  . pilonydal cyst  1981  . Sleep Study  07/23/2007  . VENTRAL HERNIA REPAIR N/A 02/27/2016   Procedure: HERNIA REPAIR VENTRAL ADULT;  Surgeon: Christene Lye, MD;  Location: [facility];  Service: General;  Laterality: N/A;   Social History   Occupational History  . Occupation: Cytogeneticist, loads parts    Comment: x 24 years  for  [employer]  Tobacco Use  . Smoking status: Never Smoker  . Smokeless tobacco: Current User    Types: Chew  . Tobacco comment: PATIENT CHEWS TOBACCO  20 years  Substance and Sexual Activity  . Alcohol use: Yes    Comment:  5 TIMES/YEAR - BE[employer]QU[employer]  . Drug use: No  . Sexual activity: Yes

## 2018-07-24 IMAGING — DX DG LUMBAR SPINE COMPLETE 4+V
5 series · 5 of 5 positions shown · non-contrast
Comparison: CT abdomen dated 08/09/2008.

CLINICAL DATA: bent over to tie shoes with pop and sudden onset of
midline back pain; no radicular symptoms.

EXAM:
LUMBAR SPINE - COMPLETE 4+ VIEW

[l-spine ap]
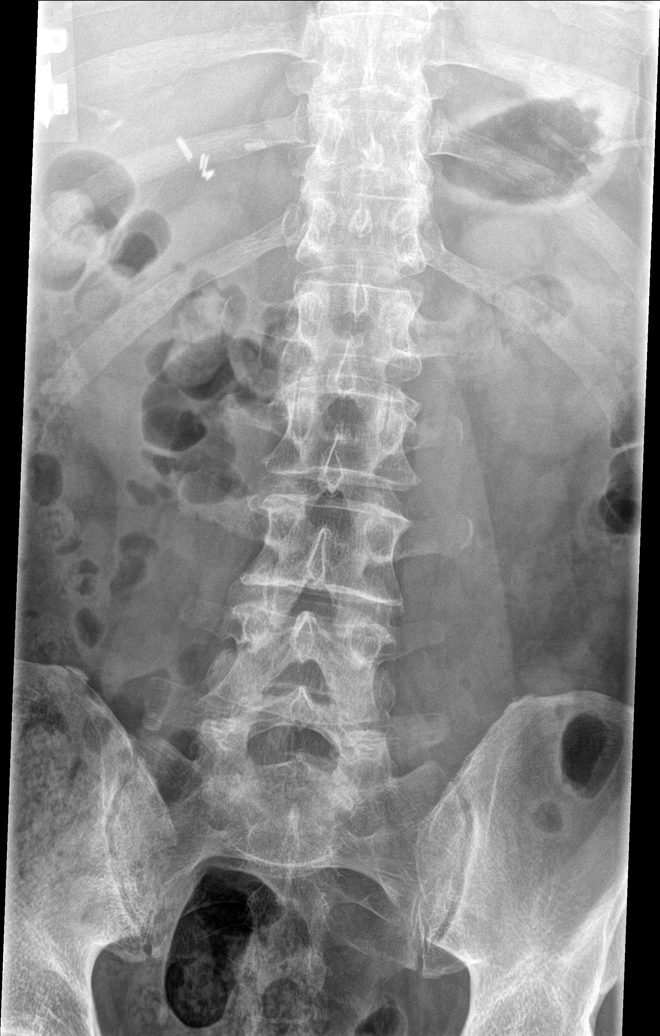

[l-spine obl (1 of 2)]
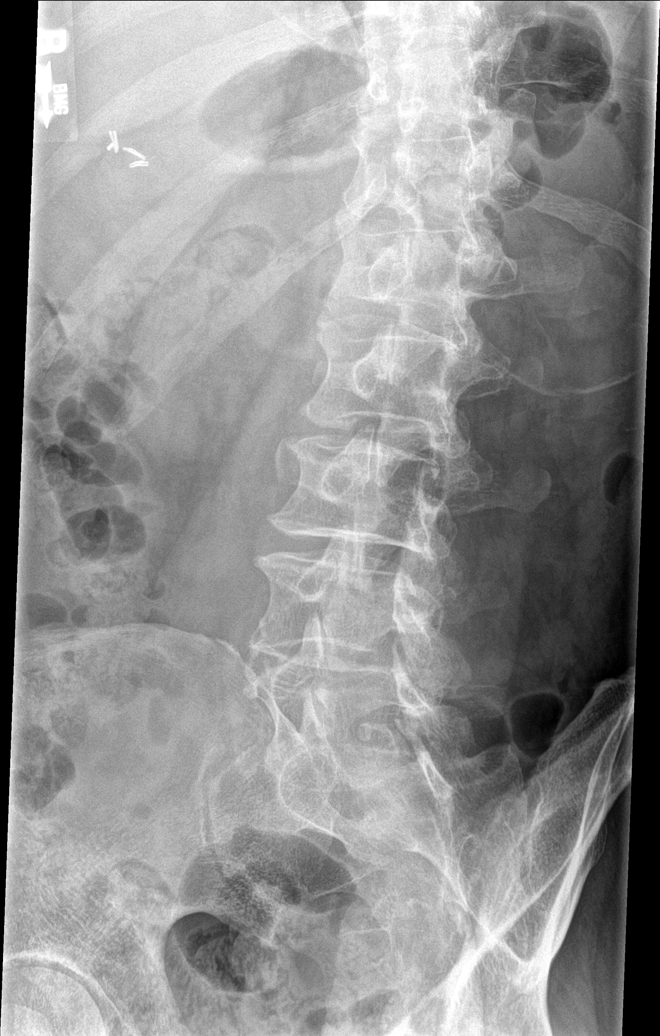

[l-spine obl (2 of 2)]
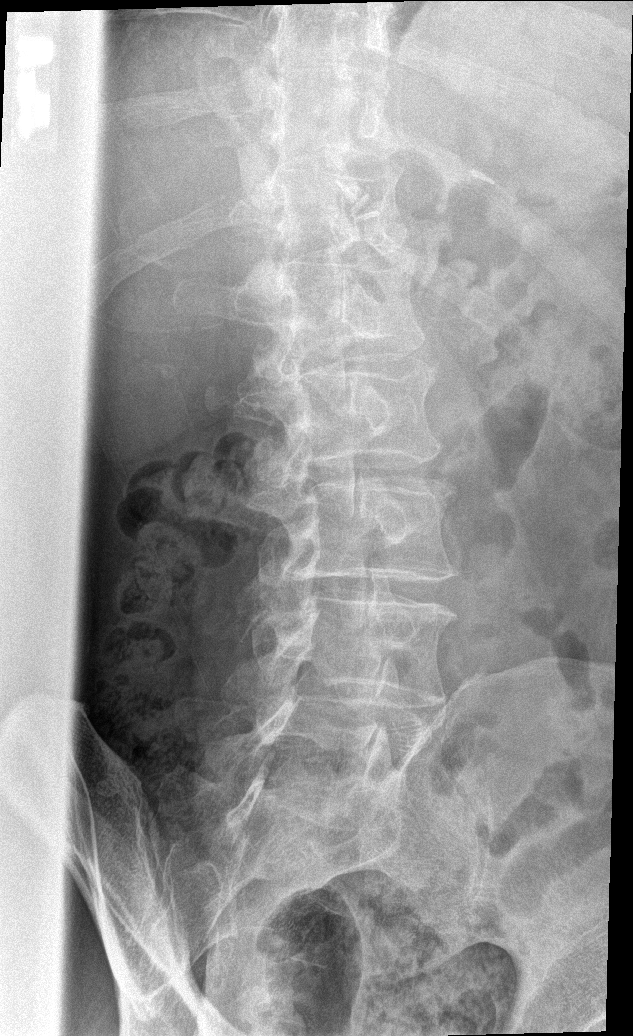

[l-spine lat]
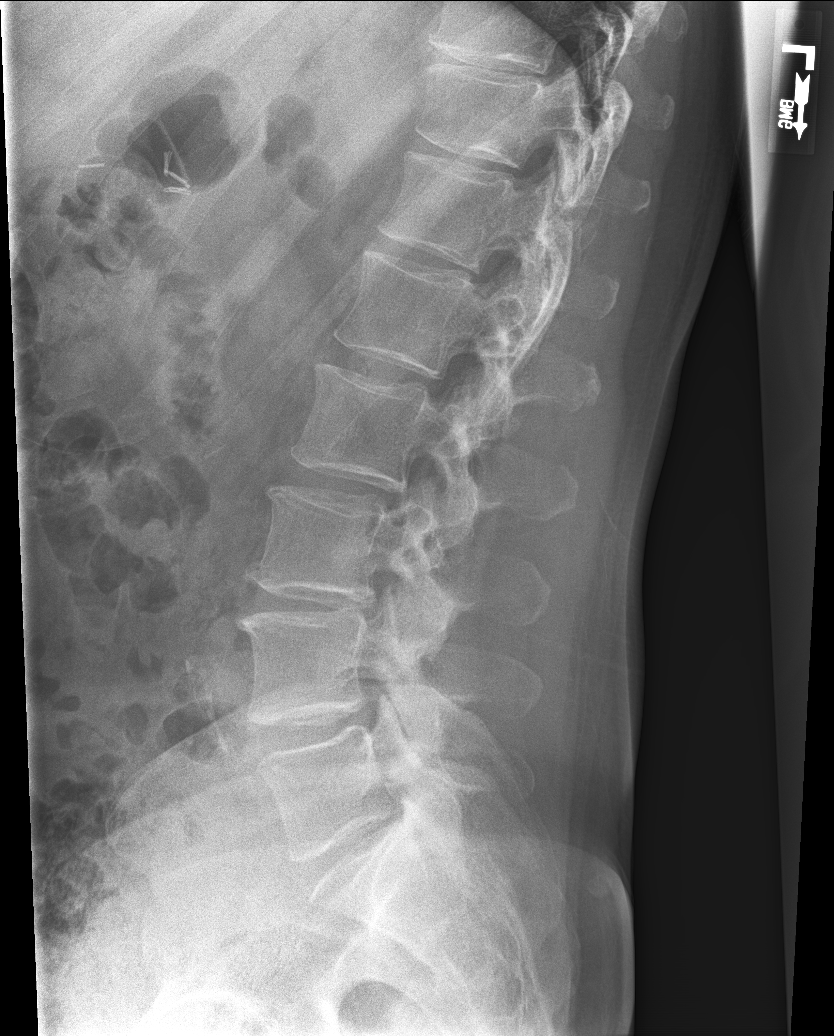

[l-spine l5-s1]
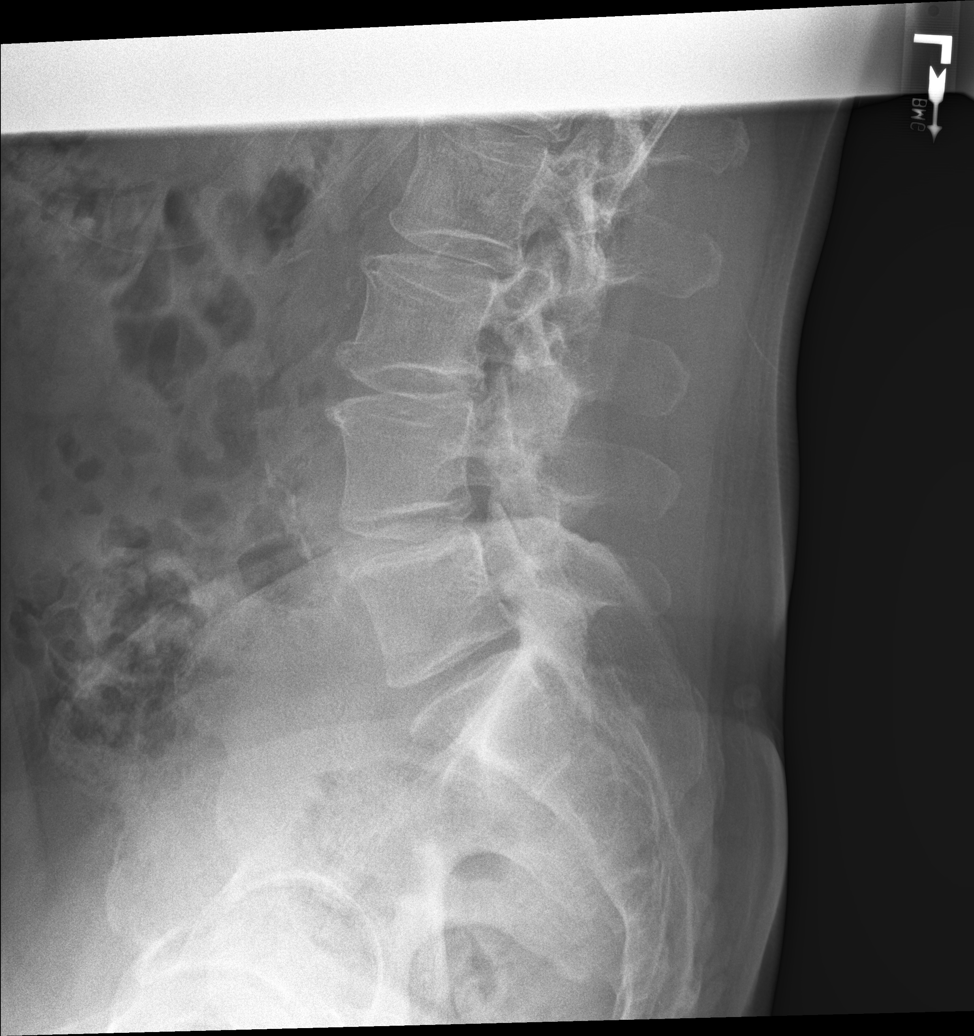

[5 of 5 positions shown; findings below may reference images not displayed]

FINDINGS: Minimal levoscoliosis appears stable. Otherwise normal alignment of
the lumbar spine. No fracture line or displaced fracture fragment
identified. No acute or suspicious osseous lesion. No evidence of
pars interarticularis defect seen. Upper sacrum appears intact and
normally aligned. Visualized paravertebral soft tissues are
unremarkable.

Mild disc desiccations are seen at the L2-3 and L3-4 levels, with
slight disc space narrowings and mild osseous spurring. Additional
mild degenerative hypertrophy at the L3 through L5 facets.
Cholecystectomy clips noted in the right upper quadrant.
IMPRESSION: 1. No acute findings.  No osseous fracture or acute subluxation.
2. Minimal levoscoliosis, stable compared to the abdomen CT of
08/09/2008.
3. Mild degenerative change within the mid and lower lumbar spine,
as detailed above.

## 2020-02-22 ENCOUNTER — Encounter: Payer: Self-pay | Admitting: Cardiovascular Disease

## 2020-02-22 ENCOUNTER — Other Ambulatory Visit: Payer: Self-pay

## 2020-02-22 ENCOUNTER — Ambulatory Visit (INDEPENDENT_AMBULATORY_CARE_PROVIDER_SITE_OTHER): Payer: BC Managed Care – PPO | Admitting: Cardiovascular Disease

## 2020-02-22 ENCOUNTER — Ambulatory Visit (INDEPENDENT_AMBULATORY_CARE_PROVIDER_SITE_OTHER): Payer: BC Managed Care – PPO

## 2020-02-22 VITALS — BP 166/93 | HR 53 | Ht 72.0 in | Wt 216.1 lb

## 2020-02-22 DIAGNOSIS — R55 Syncope and collapse: Secondary | ICD-10-CM | POA: Diagnosis not present

## 2020-02-22 DIAGNOSIS — R001 Bradycardia, unspecified: Secondary | ICD-10-CM

## 2020-02-22 DIAGNOSIS — Z01810 Encounter for preprocedural cardiovascular examination: Secondary | ICD-10-CM

## 2020-02-22 DIAGNOSIS — Z720 Tobacco use: Secondary | ICD-10-CM | POA: Diagnosis not present

## 2020-02-22 DIAGNOSIS — I10 Essential (primary) hypertension: Secondary | ICD-10-CM

## 2020-02-22 DIAGNOSIS — I495 Sick sinus syndrome: Secondary | ICD-10-CM

## 2020-02-22 NOTE — Patient Instructions (Signed)
Medication Instructions:  Your physician recommends that you continue on your current medications as directed. Please refer to the Current Medication list given to you today. *If you need a refill on your cardiac medications before your next appointment, please call your pharmacy*   Lab Work: None Ordered If you have labs (blood work) drawn today and your tests are completely normal, you will receive your results only by: Marland Kitchen MyChart Message (if you have MyChart) OR . A paper copy in the mail If you have any lab test that is abnormal or we need to change your treatment, we will call you to review the results.   Testing/Procedures: Your physician has recommended that you wear a Zio monitor for 7 days. This monitor is a medical device that records the heart's electrical activity. Doctors most often use these monitors to diagnose arrhythmias. Arrhythmias are problems with the speed or rhythm of the heartbeat. The monitor is a small device applied to your chest. You can wear one while you do your normal daily activities. While wearing this monitor if you have any symptoms to push the button and record what you felt. Once you have worn this monitor for the period of time provider prescribed (Usually 14 days), you will return the monitor device in the postage paid box. Once it is returned they will download the data collected and provide [place] with a report which the provider will then review and we will call you with those results. Important tips:  1. Avoid showering during the first 24 hours of wearing the monitor. 2. Avoid excessive sweating to help maximize wear time. 3. Do not submerge the device, no hot tubs, and no swimming pools. 4. Keep any lotions or oils away from the patch. 5. After 24 hours you may shower with the patch on. Take brief showers with your back facing the shower head.  6. Do not remove patch once it has been placed because that will interrupt data and decrease adhesive wear  time. 7. Push the button when you have any symptoms and write down what you were feeling. 8. Once you have completed wearing your monitor, remove and place into box which has postage paid and place in your outgoing mailbox.  9. If for some reason you have misplaced your box then call our office and we can provide another box and/or mail it off for you.         Follow-Up: At [facility], you and your health needs are our priority.  As part of our continuing mission to provide you with exceptional heart care, we have created designated Provider Care Teams.  These Care Teams include your primary Cardiologist (physician) and Advanced Practice Providers (APPs -  Physician Assistants and Nurse Practitioners) who all work together to provide you with the care you need, when you need it.  We recommend signing up for the patient portal called "MyChart".  Sign up information is provided on this After Visit Summary.  MyChart is used to connect with patients for Virtual Visits (Telemedicine).  Patients are able to view lab/test results, encounter notes, upcoming appointments, etc.  Non-urgent messages can be sent to your provider as well.   To learn more about what you can do with MyChart, go to [email or web address].    Your next appointment:   Follow up as needed   The format for your next appointment:   In Person  Provider:   Kathlyn Sacramento, MD   Other Instructions

## 2020-02-22 NOTE — Progress Notes (Signed)
Cardiology Office Note   Date:  02/23/2020   ID:  Francisco Gallagher, DOB [birth year], MRN [id number]  PCP:  Wardell Honour, MD  Cardiologist:   Kathlyn Sacramento, MD   Chief Complaint  Patient presents with  . New Patient (Initial Visit)    Referred by Dr. Tamala Julian for Syncope; Meds verbally reviewed with patient.      History of Present Illness: Francisco Gallagher is a 59 y.o. male who was referred by Dr. Tamala Julian for evaluation of syncope. He has known history of essential hypertension, hyperlipidemia, tobacco use and family history of coronary artery disease. He was seen by me in 2016 for exertional dyspnea.  He underwent a stress echocardiogram which was normal.He reports cardiac cath in 2001 with no obstructive disease.   He had a syncopal episode last month.  He was working in a hot humid weather and was sweating profusely throughout the day.  He did not feel well that day at all and at the end of the day he had an episode of loss of consciousness.  He was checked there at work and was told that his blood pressure was low.  He stopped taking hydrochlorothiazide after that and had no recurrent symptoms.  He denies any chest pain except if he overeats.  He has stable exertional dyspnea.  No previous syncopal episodes.  He does not take any medications that can cause bradycardia. He is scheduled for partial knee replacement later this month.   Past Medical History:  Diagnosis Date  . Arthritis    knees  . Erectile dysfunction    Cialis PRN  . GERD (gastroesophageal reflux disease)   . Glucose intolerance (impaired glucose tolerance)   . Hyperlipidemia   . Hypertension   . Hypogonadism male 08/23/2011   s/p urology consult [facility]; no treatment indicated due to potential of pregnancy.  . Mild sleep apnea 2009   NEVER WAS GIVEN CPAP PER PT  . Multinodular goiter 07/23/2007   thyroid u/s: multinodular goiter; s/p ENT consult/[facility]:  Rx for Synthroid.  . Pain in joint, site  unspecified   . Renal insufficiency    CYST ON KIDNEYS  . Thyroid disease    per pt low thyroid  . Tobacco use disorder   . Unspecified disorder of skin and subcutaneous tissue   . Unspecified hypothyroidism     Past Surgical History:  Procedure Laterality Date  . CARDIAC CATHETERIZATION  07/23/1999   negative.  . CHOLECYSTECTOMY  2003  . excision nosdule   L shouldher Left 2 2014  . INSERTION OF MESH N/A 02/27/2016   Procedure: INSERTION OF MESH;  Surgeon: Christene Lye, MD;  Location: [facility] ORS;  Service: General;  Laterality: N/A;  . pilonydal cyst  1981  . Sleep Study  07/23/2007  . VENTRAL HERNIA REPAIR N/A 02/27/2016   Procedure: HERNIA REPAIR VENTRAL ADULT;  Surgeon: Christene Lye, MD;  Location: [facility] ORS;  Service: General;  Laterality: N/A;     Current Outpatient Medications  Medication Sig Dispense Refill  . aspirin 81 MG tablet Take 81 mg by mouth as needed.     . diclofenac sodium (VOLTAREN) 1 % GEL Apply 2 g topically 4 (four) times daily. 100 g 1  . fenofibrate 160 MG tablet Take 1 tablet (160 mg total) by mouth daily. 90 tablet 3  . hydrochlorothiazide (HYDRODIURIL) 12.5 MG tablet Take 1 tablet (12.5 mg total) by mouth daily. 90 tablet 3  . levothyroxine (SYNTHROID, LEVOTHROID)  125 MCG tablet Take 1 tablet (125 mcg total) by mouth daily before breakfast. 90 tablet 3  . lisinopril (ZESTRIL) 40 MG tablet Take 40 mg by mouth daily.    . Multiple Vitamin (MULTIVITAMIN WITH MINERALS) TABS tablet Take 1 tablet by mouth daily.    . pantoprazole (PROTONIX) 40 MG tablet Take 1 tablet (40 mg total) by mouth daily. 90 tablet 3  . tadalafil (CIALIS) 20 MG tablet Take 1 tablet (20 mg total) by mouth daily as needed. 8 tablet 11  . lisinopril (PRINIVIL,ZESTRIL) 20 MG tablet Take 1 tablet (20 mg total) by mouth daily. 30 tablet 1   No current facility-administered medications for this visit.    Allergies:   Penicillins    Social History:  The patient  reports that  he has never smoked. His smokeless tobacco use includes chew. He reports current alcohol use. He reports that he does not use drugs.   Family History:  [name] family history includes Arthritis in his brother, father, and mother; Benign prostatic hyperplasia in his brother and father; COPD in his mother; Cancer in his mother; Depression in his mother; Diabetes in an other family member; Fibromyalgia in his mother; Gout in his mother; Heart disease in his maternal grandfather; Heart disease (age of onset: 38) in his mother; Hypertension in his father, mother, and paternal grandmother; Kidney disease in his mother; Rosacea in his father; Stroke in his paternal grandfather.    ROS:  Please see the history of present illness.   Otherwise, review of systems are positive for none.   All other systems are reviewed and negative.    PHYSICAL EXAM: VS:  BP (!) 166/93 (BP Location: Right Arm, Patient Position: Sitting, Cuff Size: Normal)   Pulse (!) 53   Ht 6' (1.829 m)   Wt 216 lb 2 oz (98 kg)   SpO2 97%   BMI 29.31 kg/m  , BMI Body mass index is 29.31 kg/m. GEN: Well nourished, well developed, in no acute distress  HEENT: normal  Neck: no JVD, carotid bruits, or masses Cardiac: RRR; no murmurs, rubs, or gallops,no edema  Respiratory:  clear to auscultation bilaterally, normal work of breathing GI: soft, nontender, nondistended, + BS MS: no deformity or atrophy  Skin: warm and dry, no rash Neuro:  Strength and sensation are intact Psych: euthymic mood, full affect   EKG:  EKG is ordered today. The ekg ordered today demonstrates sinus bradycardia with no significant ST or T wave changes.  Heart rate is 48 bpm.   Recent Labs: No results found for requested labs within last 8760 hours.    Lipid Panel    Component Value Date/Time   CHOL 148 02/02/2018 1722   TRIG 120 02/02/2018 1722   HDL 26 (L) 02/02/2018 1722   CHOLHDL 5.7 (H) 02/02/2018 1722   CHOLHDL 6.4 (H) 02/06/2016 0821    VLDL 33 (H) 02/06/2016 0821   LDLCALC 98 02/02/2018 1722      Wt Readings from Last 3 Encounters:  02/22/20 216 lb 2 oz (98 kg)  02/02/18 218 lb (98.9 kg)  08/02/17 226 lb 9.6 oz (102.8 kg)        PAD Screen 02/22/2020  Previous PAD dx? No  Previous surgical procedure? No  Pain with walking? No  Feet/toe relief with dangling? No  Painful, non-healing ulcers? No  Extremities discolored? No      ASSESSMENT AND PLAN:  1.  Syncope: Likely vasovagal heat syncope.  He is probably predisposed  given underlying bradycardia.  In addition, he was taking hydrochlorothiazide at that time.  I agree with discontinuation of hydrochlorothiazide.  He is not on any medication that can cause bradycardia.  His thyroid function testing have been normal in the past.  I requested a 7-day ZIO monitor to ensure no significant bradycardic events.  The patient had a stress echocardiogram in 2016 which was normal.  2.  Essential hypertension: His blood pressure is elevated.  He is on lisinopril.  I agree with stopping hydrochlorothiazide.  If blood pressure continues to be elevated, consider amlodipine.  3.  Preop cardiovascular evaluation for knee surgery: Currently with no anginal symptoms.  Previous negative ischemic cardiac evaluation 2016 and his EKG does not show any ischemic changes.  No need for ischemic cardiac work-up.  4.  Chewing tobacco: Discussed the importance of abstinence.   Disposition:   FU with me as needed if monitor is abnormal.  Signed,  Kathlyn Sacramento, MD  02/23/2020 3:31 PM    [place]

## 2020-02-23 ENCOUNTER — Ambulatory Visit: Payer: Self-pay | Admitting: Internal Medicine

## 2020-03-01 ENCOUNTER — Other Ambulatory Visit: Payer: Self-pay | Admitting: Family Medicine

## 2020-03-01 DIAGNOSIS — K219 Gastro-esophageal reflux disease without esophagitis: Secondary | ICD-10-CM

## 2020-03-02 NOTE — Telephone Encounter (Signed)
Requested  medications are  due for refill today yes  Requested medications are on the active medication list yees  Last refill 03/10/20  Last visit 2019  Future visit scheduled no  Notes to clinic Failed protocol of visit within 12 months

## 2021-10-30 ENCOUNTER — Other Ambulatory Visit: Payer: Self-pay

## 2021-10-30 ENCOUNTER — Encounter (HOSPITAL_COMMUNITY): Payer: Self-pay | Admitting: *Deleted

## 2021-10-30 DIAGNOSIS — Z20822 Contact with and (suspected) exposure to covid-19: Secondary | ICD-10-CM | POA: Diagnosis not present

## 2021-10-30 DIAGNOSIS — M791 Myalgia, unspecified site: Secondary | ICD-10-CM | POA: Diagnosis present

## 2021-10-30 DIAGNOSIS — R509 Fever, unspecified: Secondary | ICD-10-CM | POA: Insufficient documentation

## 2021-10-30 DIAGNOSIS — Z5321 Procedure and treatment not carried out due to patient leaving prior to being seen by health care provider: Secondary | ICD-10-CM | POA: Diagnosis not present

## 2021-10-30 DIAGNOSIS — R519 Headache, unspecified: Secondary | ICD-10-CM | POA: Diagnosis not present

## 2021-10-30 LAB — RESP PANEL BY RT-PCR (FLU A&B, COVID) ARPGX2
Influenza A by PCR: NEGATIVE
Influenza B by PCR: NEGATIVE
SARS Coronavirus 2 by RT PCR: NEGATIVE

## 2021-10-30 MED ORDER — ACETAMINOPHEN 500 MG PO TABS
1000.0000 mg | ORAL_TABLET | Freq: Once | ORAL | Status: AC
Start: 2021-10-30 — End: 2021-10-30
  Administered 2021-10-30: 1000 mg via ORAL
  Filled 2021-10-30: qty 2

## 2021-10-30 NOTE — ED Triage Notes (Signed)
Pt c/o generalized body aches with headache and fever; pt c/o feeling like his eyeballs are burning ?

## 2021-10-31 ENCOUNTER — Emergency Department (HOSPITAL_COMMUNITY)
Admission: EM | Admit: 2021-10-31 | Discharge: 2021-10-31 | Discharge status: Against Medical Advice | Payer: BC Managed Care – PPO | Attending: Emergency Medicine | Admitting: Emergency Medicine

## 2022-12-31 NOTE — Progress Notes (Unsigned)
Cardiology Office Note:  .   Date:  01/01/2023  ID:  Deidre Ala Atkins, DOB Feb 01, 1961, MRN [id number] PCP: Ethelda Chick, MD  [facility] Providers Cardiologist:  Lorine Bears, MD    History of Present Illness: .   Francisco Gallagher is a 62 y.o. male with past medical history of essential hypertension, hyperlipidemia, tobacco use, family history of coronary artery disease and syncope, who presents today for follow-up.  He was last seen in clinic 02/22/2020 by Dr.Arida for the evaluation of syncope.  He underwent stress echocardiogram which was normal.  He reported cardiac catheter 2001 with no obstructive disease disease.  When he had his event he was working in the hot weather was sweating profusely throughout the day.  He did not feel very well that day at home by the end of the day had an episode of loss of consciousness.  His wife checked his blood pressure and his blood pressure was low he had stopped taking HCTZ after that and had no recurrent symptoms.  He was ordered a ZIO monitor for 7 days sent to stay off of the HCTZ he had previously discontinued.  He returns to clinic today stating that he has felt fine.  He does have some fatigue and shortness of breath that is unchanged that he has had for an extended period of time.  He stated that he has not had his physical done with his PCP back in April of this year and was found to have a low heart rate.  He stated he also had his sleep study completed for sleep apnea was noted to have a heart rate in the 40s and was advised a referral will be sent to his primary cardiologist.  He had not heard anything about his referral so he called to make his own appointment.  He comes in today with questions about low heart rate.  Denies any hospitalizations or visits to the emergency department.  Continues to work Press [employer].  ROS: 10 point review of systems has been reviewed and considered negative with exception of what is listed in  the HPI  Studies Reviewed: Marland Kitchen    EKG: Sinus bradycardia with a rate of 38, LVH  Zio XT 02/23/20 Normal sinus rhythm with an average heart rate of 52 bpm. Minimum heart rate was 35 bpm.  This happened at 3:55 am and presumably during sleep time. 2 short runs of SVT.  The longest lasted 13 beats. Rare PACs and rare PVCs. Risk Assessment/Calculations:     HYPERTENSION CONTROL Vitals:   01/01/23 0814 01/01/23 0821  BP: (!) 160/94 (!) 154/88    [name] blood pressure is elevated above target today.  In order to address the patient's elevated BP: Blood pressure will be monitored at home to determine if medication changes need to be made.          Physical Exam:   VS:  BP (!) 154/88 (BP Location: Left Arm, Patient Position: Sitting, Cuff Size: Normal)   Pulse (!) 38   Ht 6' (1.829 m)   Wt 223 lb (101.2 kg)   SpO2 98%   BMI 30.24 kg/m    Wt Readings from Last 3 Encounters:  01/01/23 223 lb (101.2 kg)  10/30/21 215 lb (97.5 kg)  02/22/20 216 lb 2 oz (98 kg)    GEN: Well nourished, well developed in no acute distress NECK: No JVD; No carotid bruits CARDIAC: RRR, bradycardic, no murmurs, rubs, gallops RESPIRATORY:  Clear to auscultation without rales, wheezing or rhonchi  ABDOMEN: Soft, non-tender, non-distended EXTREMITIES:  No edema; No deformity   ASSESSMENT AND PLAN: .   Symptomatic bradycardia with fatigue and shortness of breath.  States that bradycardia was noted on physical approximately 2 months prior.  He has not had any lightheadedness, dizziness, syncopal or near syncopal episodes.  He does have a degree of chronotropic incompetence his EKG revealed a heart rate of 38 and walking 2 laps around the office were only able to get his heart rate to approximately 54.  He was referred to EP today for further evaluation for pacer implantation.  Review medications refilled AV nodal blocking agents or antiemetics.  EKG today revealed sinus bradycardia with a rate of 38 with no  ischemic changes.  Blood work completed in April ordered by his PCP has been reviewed with no electrolyte or thyroid abnormalities noted.  Essential hypertension: Blood pressure today 160/94 recheck 154/88.  Patient had just taken his medication prior to coming to his visit today.  He is continued on HCTZ 12.5 mg daily, lisinopril 40 mg daily.  He has been encouraged to continue to monitor his pressures at home as well.  Will continue to monitor his pressures for now and increase medication as needed.  Mixed hyperlipidemia with last LDL of 113.  This continues to be monitored by his PCP.  He was just recently started on rosuvastatin 5 mg daily in April and continued on his fenofibrate 160 mg daily.  He has fasting lipids in a couple of months to recheck the effectiveness of his rosuvastatin.  Exertional dyspnea that has been present since last time he was seen in 2021.  He states that it is unchanged from previous visits.  Regarding changes noted we will consider echocardiogram at that time.  Continues to use chewing tobacco with cessation recommended.       Dispo: Patient to return to clinic to see MD/APP in 3 months or sooner if needed.  He has been referred to EP next available appointment to evaluate his bradycardia.  Signed, Laronda Lisby, NP

## 2023-01-01 ENCOUNTER — Encounter: Payer: Self-pay | Admitting: Cardiology

## 2023-01-01 ENCOUNTER — Ambulatory Visit: Payer: BC Managed Care – PPO | Attending: Cardiology | Admitting: Cardiology

## 2023-01-01 VITALS — BP 154/88 | HR 38 | Ht 72.0 in | Wt 223.0 lb

## 2023-01-01 DIAGNOSIS — I10 Essential (primary) hypertension: Secondary | ICD-10-CM

## 2023-01-01 DIAGNOSIS — E7800 Pure hypercholesterolemia, unspecified: Secondary | ICD-10-CM

## 2023-01-01 DIAGNOSIS — R001 Bradycardia, unspecified: Secondary | ICD-10-CM

## 2023-01-01 DIAGNOSIS — R0609 Other forms of dyspnea: Secondary | ICD-10-CM

## 2023-01-01 DIAGNOSIS — Z720 Tobacco use: Secondary | ICD-10-CM

## 2023-01-01 DIAGNOSIS — R55 Syncope and collapse: Secondary | ICD-10-CM | POA: Diagnosis not present

## 2023-01-01 DIAGNOSIS — R5383 Other fatigue: Secondary | ICD-10-CM

## 2023-01-01 NOTE — Patient Instructions (Signed)
Medication Instructions:  Your physician recommends that you continue on your current medications as directed. Please refer to the Current Medication list given to you today.  *If you need a refill on your cardiac medications before your next appointment, please call your pharmacy*  Lab Work: -None ordered If you have labs (blood work) drawn today and your tests are completely normal, you will receive your results only by: MyChart Message (if you have MyChart) OR A paper copy in the mail If you have any lab test that is abnormal or we need to change your treatment, we will call you to review the results.  Testing/Procedures: -None ordered  Follow-Up: At [facility], you and your health needs are our priority.  As part of our continuing mission to provide you with exceptional heart care, we have created designated Provider Care Teams.  These Care Teams include your primary Cardiologist (physician) and Advanced Practice Providers (APPs -  Physician Assistants and Nurse Practitioners) who all work together to provide you with the care you need, when you need it.  Your next appointment:   3 - 4  month(s)  Provider:   You may see Lorine Bears, MD or one of the following Advanced Practice Providers on your designated Care Team:   Nicolasa Ducking, NP Eula Listen, PA-C Cadence Fransico Michael, PA-C Charlsie Quest, NP    Other Instructions Ambulatory referral to Cardiac Electrophysiology

## 2023-02-05 ENCOUNTER — Institutional Professional Consult (permissible substitution): Payer: BC Managed Care – PPO | Admitting: Cardiology

## 2023-03-04 ENCOUNTER — Encounter: Payer: Self-pay | Admitting: Cardiology

## 2023-03-04 ENCOUNTER — Ambulatory Visit: Payer: BC Managed Care – PPO | Attending: Cardiology | Admitting: Cardiology

## 2023-03-04 VITALS — BP 130/80 | HR 65 | Ht 72.0 in | Wt 221.6 lb

## 2023-03-04 DIAGNOSIS — R55 Syncope and collapse: Secondary | ICD-10-CM | POA: Diagnosis not present

## 2023-03-04 DIAGNOSIS — R001 Bradycardia, unspecified: Secondary | ICD-10-CM | POA: Diagnosis not present

## 2023-03-04 NOTE — Patient Instructions (Signed)
Medication Instructions:  The current medical regimen is effective;  continue present plan and medications.  *If you need a refill on your cardiac medications before your next appointment, please call your pharmacy*   Testing/Procedures: Your provider has ordered a exercise tolerance test (2-3 weeks). This test will evaluate the blood supply to your heart muscle during periods of exercise and rest. For this test, you will raise your heart rate by walking on a treadmill at different levels.   you may eat a light breakfast/ lunch prior to your procedure no caffeine for 24 hours prior to your test (coffee, tea, soft drinks, or chocolate)  no smoking/ vaping for 4 hours prior to your test you may take your regular medications the day of your test  bring any inhalers with you to your test wear comfortable clothing & tennis/ non-skid shoes to walk on the treadmill  This will take place at [address], [place] [address]    Follow-Up: At [facility], you and your health needs are our priority.  As part of our continuing mission to provide you with exceptional heart care, we have created designated Provider Care Teams.  These Care Teams include your primary Cardiologist (physician) and Advanced Practice Providers (APPs -  Physician Assistants and Nurse Practitioners) who all work together to provide you with the care you need, when you need it.  We recommend signing up for the patient portal called "MyChart".  Sign up information is provided on this After Visit Summary.  MyChart is used to connect with patients for Virtual Visits (Telemedicine).  Patients are able to view lab/test results, encounter notes, upcoming appointments, etc.  Non-urgent messages can be sent to your provider as well.   To learn more about what you can do with MyChart, go to [email or web address].    Your next appointment:   4 week(s)  Provider:   Steffanie Dunn, MD

## 2023-03-04 NOTE — Progress Notes (Signed)
Electrophysiology Office Note:    Date:  03/04/2023   ID:  Francisco Gallagher, DOB 1961-03-26, MRN [id number]  [facility] Cardiologist:  Lorine Bears, MD  [facility] Electrophysiologist:  Lanier Prude, MD   Referring MD: Charlsie Quest, NP   Chief Complaint: sinus bradycardia  History of Present Illness:    Francisco Gallagher is a 62 y.o. malewho I am seeing today for an evaluation of bradycardia at the request of Charlsie Quest, NP.  The patient was last seen by [facility] on January 01, 2023.  The patient has a medical history that includes hypertension, hyperlipidemia, tobacco abuse, syncope.  The patient had a syncopal episode in 2021.  He was seen by Dr. Kirke Corin.  He was hypotensive around the time of his episode.  Hydrochlorothiazide was stopped and he felt better without recurrence.  When he saw [name] he reported feeling fine.  He was evaluated for sleep apnea by his primary care physician.  During the sleep apnea evaluation he was bradycardic into the 40s.  The patient had a heart rate in the 30s at his visit with [facility].  At the appointment with [name] he said he was not having any lightheadedness, dizziness, syncope or near syncope.  He tells me had a prior syncopal episode while at work.  He was prescribed a diuretic at that time. 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  He was dehydrated and became sweaty before losing consciousness.  He has not had a recurrent episode.  His brother who is 2 years older just had a pacemaker implanted.  No family history of sudden cardiac death.  No arrhythmic sounding syncopal history.      Their past medical, social and family history was reveiwed.   ROS:   Please see the history of present illness.    All other systems reviewed and are negative.  EKGs/Labs/Other Studies Reviewed:    The following studies were reviewed today:  March 31, 2020 ZIO monitor  personally reviewed Average heart rate 52 Minimum heart rate 35 during the nighttime hours  January 01, 2023 EKG shows sinus bradycardia with a ventricular rate in the 30s.      Physical Exam:    VS:  BP 130/80   Pulse 65   Ht 6' (1.829 m)   Wt 221 lb 9.6 oz (100.5 kg)   SpO2 97%   BMI 30.05 kg/m     Wt Readings from Last 3 Encounters:  03/04/23 221 lb 9.6 oz (100.5 kg)  01/01/23 223 lb (101.2 kg)  10/30/21 215 lb (97.5 kg)     GEN:  Well nourished, well developed in no acute distress CARDIAC: RRR, no murmurs, rubs, gallops RESPIRATORY:  Clear to auscultation without rales, wheezing or rhonchi       ASSESSMENT AND PLAN:    1. Sinus bradycardia by electrocardiogram   2. Vasovagal syncope     #Sinus bradycardia No evidence of AV conduction disease.  No arrhythmic syncope. No readily identifiable reversible causes. Recommend exercise ECG to assess chronotropic competence.  I did discuss the possibility of him needing a permanent pacemaker.  I briefly discussed the pacemaker implant procedure.  #History of vasovagal syncope Remote history of syncopal episode consistent with hypervagotonia.  Have recommended that he stay adequately hydrated.  He should avoid triggers.  If he experiences prodromal symptoms he should immediately get to the ground to avoid personal injury.  Follow-up in 4 weeks after ETT.       Signed, Rossie Muskrat. Lalla Brothers, MD, [facility], [facility] 03/04/2023 3:36 PM    Electrophysiology [facility]

## 2023-03-20 ENCOUNTER — Ambulatory Visit
Admission: RE | Admit: 2023-03-20 | Discharge: 2023-03-20 | Disposition: A | Discharge status: Home / Self Care | Payer: BC Managed Care – PPO | Source: Ambulatory Visit | Attending: Cardiology | Admitting: Cardiology

## 2023-03-20 DIAGNOSIS — I4510 Unspecified right bundle-branch block: Secondary | ICD-10-CM

## 2023-03-20 DIAGNOSIS — R55 Syncope and collapse: Secondary | ICD-10-CM

## 2023-03-20 DIAGNOSIS — R001 Bradycardia, unspecified: Secondary | ICD-10-CM

## 2023-03-21 LAB — EXERCISE TOLERANCE TEST
Angina Index: 0
Estimated workload: 11.7
Exercise duration (min): 10 min
Exercise duration (sec): 1 s
MPHR: 160 {beats}/min
Peak HR: 129 {beats}/min
Percent HR: 80 %
Rest HR: 56 {beats}/min

## 2023-04-07 ENCOUNTER — Ambulatory Visit: Payer: BC Managed Care – PPO | Admitting: Cardiology

## 2023-04-15 NOTE — Progress Notes (Unsigned)
Electrophysiology Office Follow up Visit Note:    Date:  04/16/2023   ID:  Francisco Gallagher, DOB October 11, 1960, MRN [id number]  PCP:  Ethelda Chick, MD  [facility] Cardiologist:  Lorine Bears, MD  [facility] Electrophysiologist:  Lanier Prude, MD    Interval History:    Francisco Gallagher is a 62 y.o. male who presents for a follow up visit.   Last seen March 04, 2023 for history of sinus bradycardia and vasovagal syncope.  We plan for exercise tolerance test to evaluate his chronotropic competence.  The patient denies any syncope or presyncope.  He checks his heart rates at home and sometimes they are 40 bpm while at rest.     Past medical, surgical, social and family history were reviewed.  ROS:   Please see the history of present illness.    All other systems reviewed and are negative.  EKGs/Labs/Other Studies Reviewed:    The following studies were reviewed today:  March 21, 2023 exercise tolerance test Good chronotropic response to exercise Exercise-induced right bundle branch block       Physical Exam:    VS:  BP 110/72   Pulse 65   Ht 6' (1.829 m)   Wt 219 lb 9.6 oz (99.6 kg)   SpO2 97%   BMI 29.78 kg/m     Wt Readings from Last 3 Encounters:  04/16/23 219 lb 9.6 oz (99.6 kg)  03/04/23 221 lb 9.6 oz (100.5 kg)  01/01/23 223 lb (101.2 kg)     GEN:  Well nourished, well developed in no acute distress CARDIAC: RRR, no murmurs, rubs, gallops RESPIRATORY:  Clear to auscultation without rales, wheezing or rhonchi       ASSESSMENT:    1. Sinus bradycardia by electrocardiogram   2. Vasovagal syncope    PLAN:    In order of problems listed above:  #Sinus bradycardia Good chronotropic response to exercise.  No immediate need for permanent pacemaker implant.  I have discussed the possibility of him needing a pacemaker in the future.  I have reviewed the red flag symptoms that should prompt emergent evaluation including presyncope,  syncope.  #History of vasovagal syncope I have encouraged him to stay adequately hydrated.  His remote history of syncope is consistent with a diagnosis of hyper vagotonia.  Follow-up 1 year with APP.   Signed, Steffanie Dunn, MD, [facility], [facility] 04/16/2023 3:25 PM    Electrophysiology [facility]

## 2023-04-16 ENCOUNTER — Encounter: Payer: Self-pay | Admitting: Cardiology

## 2023-04-16 ENCOUNTER — Ambulatory Visit: Payer: BC Managed Care – PPO | Attending: Cardiology | Admitting: Cardiology

## 2023-04-16 VITALS — BP 110/72 | HR 65 | Ht 72.0 in | Wt 219.6 lb

## 2023-04-16 DIAGNOSIS — R55 Syncope and collapse: Secondary | ICD-10-CM

## 2023-04-16 DIAGNOSIS — R001 Bradycardia, unspecified: Secondary | ICD-10-CM

## 2023-04-16 NOTE — Patient Instructions (Signed)
 Medication Instructions:  Your physician recommends that you continue on your current medications as directed. Please refer to the Current Medication list given to you today.  *If you need a refill on your cardiac medications before your next appointment, please call your pharmacy*  Follow-Up: At [facility], you and your health needs are our priority.  As part of our continuing mission to provide you with exceptional heart care, we have created designated Provider Care Teams.  These Care Teams include your primary Cardiologist (physician) and Advanced Practice Providers (APPs -  Physician Assistants and Nurse Practitioners) who all work together to provide you with the care you need, when you need it.  Your next appointment:   1 year  Provider:   You will see one of the following Advanced Practice Providers on your designated Care Team:   Francis Dowse, Charlott Holler [address]" [place], [place] Sherie Don, NP Canary Brim, NP

## 2023-04-18 ENCOUNTER — Ambulatory Visit: Payer: BC Managed Care – PPO | Admitting: Cardiology

## 2023-08-23 ENCOUNTER — Ambulatory Visit
Admission: EM | Admit: 2023-08-23 | Discharge: 2023-08-23 | Disposition: A | Discharge status: Home / Self Care | Payer: BC Managed Care – PPO | Attending: Emergency Medicine | Admitting: Emergency Medicine

## 2023-08-23 DIAGNOSIS — J101 Influenza due to other identified influenza virus with other respiratory manifestations: Secondary | ICD-10-CM

## 2023-08-23 LAB — POC COVID19/FLU A&B COMBO
Covid Antigen, POC: NEGATIVE
Influenza A Antigen, POC: POSITIVE — AB
Influenza B Antigen, POC: NEGATIVE

## 2023-08-23 MED ORDER — PREDNISONE 10 MG (21) PO TBPK: ORAL_TABLET | Freq: Every day | ORAL | 0 refills | Status: DC

## 2023-08-23 MED ORDER — IPRATROPIUM BROMIDE 0.03 % NA SOLN: 2.0000 | Freq: Two times a day (BID) | NASAL | 0 refills | Status: AC

## 2023-08-23 MED ORDER — OSELTAMIVIR PHOSPHATE 75 MG PO CAPS: 75.0000 mg | ORAL_CAPSULE | Freq: Two times a day (BID) | ORAL | 0 refills | Status: DC

## 2023-08-23 NOTE — ED Triage Notes (Signed)
Pt said he has been having congestion, headache, and body aches since yesterday. Low grade fever today.

## 2023-08-23 NOTE — Discharge Instructions (Addendum)
Influenza  a virus and should steadily improve in time it can take up to 7 to 10 days before you truly start to see a turnaround however things will get better    Tamiflu every morning and every evening to reduce the amount of virus in the body which helps to tone down symptoms  For sinus pressure begin prednisone every morning with food as directed to reduce internal inflammation  You may use nasal spray every morning and every evening to clear out congestion from the sinuses You can take Tylenol and/or Ibuprofen as needed for fever reduction and pain relief.   For cough: honey 1/2 to 1 teaspoon (you can dilute the honey in water or another fluid).  You can also use guaifenesin and dextromethorphan for cough. You can use a humidifier for chest congestion and cough.  If you don't have a humidifier, you can sit in the bathroom with the hot shower running.      For sore throat: try warm salt water gargles, cepacol lozenges, throat spray, warm tea or water with lemon/honey, popsicles or ice, or OTC cold relief medicine for throat discomfort.   For congestion: take a daily anti-histamine like Zyrtec, Claritin, and a oral decongestant, such as pseudoephedrine.  You can also use Flonase 1-2 sprays in each nostril daily.   It is important to stay hydrated: drink plenty of fluids (water, gatorade/powerade/pedialyte, juices, or teas) to keep your throat moisturized and help further relieve irritation/discomfort.

## 2023-08-24 NOTE — ED Provider Notes (Signed)
[facility]    CSN: [id number] Arrival date & time: 08/23/23  1344      History   Chief Complaint Chief Complaint  Patient presents with   Headache   Nasal Congestion   Generalized Body Aches    HPI Francisco Gallagher is a 63 y.o. male.   Patient presents for evaluation of chills, body aches, headaches, nasal congestion, nonproductive cough and the pain to the roof of the mouth beginning 1 day ago.  Known sick contact in household.  Tolerating food and liquids.  Has attempted use of Tylenol and Alka-Seltzer, Afrin nasal spray.  Past Medical History:  Diagnosis Date   Arthritis    knees   Erectile dysfunction    Cialis PRN   GERD (gastroesophageal reflux disease)    Glucose intolerance (impaired glucose tolerance)    Hyperlipidemia    Hypertension    Hypogonadism male 08/23/2011   s/p urology consult [facility]; no treatment indicated due to potential of pregnancy.   Mild sleep apnea 2009   NEVER WAS GIVEN CPAP PER PT   Multinodular goiter 07/23/2007   thyroid u/s: multinodular goiter; s/p ENT consult/[facility]:  Rx for Synthroid.   Pain in joint, site unspecified    Renal insufficiency    CYST ON KIDNEYS   Thyroid disease    per pt low thyroid   Tobacco use disorder    Unspecified disorder of skin and subcutaneous tissue    Unspecified hypothyroidism     Patient Active Problem List   Diagnosis Date Noted   Primary osteoarthritis of right knee 07/08/2016   Renal insufficiency 02/06/2015   Glucose intolerance (impaired glucose tolerance) 02/16/2014   Essential hypertension, benign 08/03/2012   Hyperlipidemia 08/03/2012   Hypothyroidism 08/03/2012   GERD (gastroesophageal reflux disease) 08/03/2012   Erectile dysfunction 08/03/2012   Hypogonadism male 08/03/2012    Past Surgical History:  Procedure Laterality Date   CARDIAC CATHETERIZATION  07/23/1999   negative.   CHOLECYSTECTOMY  07/22/2001   excision nosdule   L shouldher Left 08/22/2012    INSERTION OF MESH N/A 02/27/2016   Procedure: INSERTION OF MESH;  Surgeon: Kieth Brightly, MD;  Location: [facility] ORS;  Service: General;  Laterality: N/A;   MEDIAL PARTIAL KNEE REPLACEMENT Right    pilonydal cyst  07/23/1979   Sleep Study  07/23/2007   VENTRAL HERNIA REPAIR N/A 02/27/2016   Procedure: HERNIA REPAIR VENTRAL ADULT;  Surgeon: Kieth Brightly, MD;  Location: [facility] ORS;  Service: General;  Laterality: N/A;       Home Medications    Prior to Admission medications   Medication Sig Start Date End Date Taking? Authorizing Provider  ipratropium (ATROVENT) 0.03 % nasal spray Place 2 sprays into both nostrils every 12 (twelve) hours. 08/23/23  Yes Alletta Mattos, Elita Boone, NP  oseltamivir (TAMIFLU) 75 MG capsule Take 1 capsule (75 mg total) by mouth every 12 (twelve) hours. 08/23/23  Yes Orrie Schubert R, NP  predniSONE (STERAPRED UNI-PAK 21 TAB) 10 MG (21) TBPK tablet Take by mouth daily. Take 6 tabs by mouth daily  for 1 days, then 5 tabs for 1 days, then 4 tabs for 1 days, then 3 tabs for 1 days, 2 tabs for 1 days, then 1 tab by mouth daily for 1 days 08/23/23  Yes Pascal Stiggers R, NP  diclofenac sodium (VOLTAREN) 1 % GEL Apply 2 g topically 4 (four) times daily. Patient taking differently: Apply 2 g topically 4 (four) times daily as needed. 02/02/18  Ethelda Chick, MD  fenofibrate 160 MG tablet Take 1 tablet (160 mg total) by mouth daily. 08/02/17   Ethelda Chick, MD  hydrochlorothiazide (HYDRODIURIL) 12.5 MG tablet Take 1 tablet (12.5 mg total) by mouth daily. 08/02/17   Ethelda Chick, MD  hydrocortisone (ANUSOL-HC) 25 MG suppository Place 25 mg rectally 2 (two) times daily as needed for hemorrhoids or anal itching.    [provider]  levothyroxine (SYNTHROID, LEVOTHROID) 125 MCG tablet Take 1 tablet (125 mcg total) by mouth daily before breakfast. 08/02/17   Ethelda Chick, MD  lisinopril (ZESTRIL) 40 MG tablet Take 40 mg by mouth daily.    [provider]   methocarbamol (ROBAXIN) 500 MG tablet as needed for muscle spasms. 04/09/23   [provider]  Multiple Vitamin (MULTIVITAMIN WITH MINERALS) TABS tablet Take 1 tablet by mouth daily.    [provider]  pantoprazole (PROTONIX) 40 MG tablet Take 1 tablet (40 mg total) by mouth daily. 08/02/17   Ethelda Chick, MD  rosuvastatin (CRESTOR) 5 MG tablet Take 5 mg by mouth daily. 11/04/22   [provider]  tadalafil (CIALIS) 20 MG tablet Take 1 tablet (20 mg total) by mouth daily as needed. 08/02/17   Ethelda Chick, MD  triamcinolone cream (KENALOG) 0.1 % as needed (rash).    [provider]    Family History Family History  Problem Relation Age of Onset   Arthritis Mother    Hypertension Mother    COPD Mother    Depression Mother    Fibromyalgia Mother    Gout Mother    Kidney disease Mother    Cancer Mother        lung   Heart disease Mother 62       AMI s/p stenting   Hypertension Father    Benign prostatic hyperplasia Father    Rosacea Father    Arthritis Father    Arthritis Brother    Benign prostatic hyperplasia Brother    Heart disease Maternal Grandfather    Hypertension Paternal Grandmother    Stroke Paternal Grandfather    Diabetes Other    Colon cancer Neg Hx     Social History Social History   Tobacco Use   Smoking status: Never   Smokeless tobacco: Current    Types: Chew   Tobacco comments:    PATIENT CHEWS TOBACCO  20 years  Vaping Use   Vaping status: Never Used  Substance Use Topics   Alcohol use: Yes    Comment:  5 TIMES/YEAR - BEER AND LIQUOR   Drug use: No     Allergies   Penicillins   Review of Systems Review of Systems  Neurological:  Positive for headaches.     Physical Exam Triage Vital Signs ED Triage Vitals  Encounter Vitals Group     BP 08/23/23 1436 127/84     Systolic BP Percentile --      Diastolic BP Percentile --      Pulse Rate 08/23/23 1436 75     Resp 08/23/23 1436 16     Temp  08/23/23 1436 99.9 F (37.7 C)     Temp Source 08/23/23 1436 Oral     SpO2 08/23/23 1436 94 %     Weight --      Height --      Head Circumference --      Peak Flow --      Pain Score 08/23/23 1437 7  Pain Loc --      Pain Education --      Exclude from Growth Chart --    No data found.  Updated Vital Signs BP 127/84 (BP Location: Left Arm)   Pulse 75   Temp 99.9 F (37.7 C) (Oral)   Resp 16   SpO2 94%   Visual Acuity Right Eye Distance:   Left Eye Distance:   Bilateral Distance:    Right Eye Near:   Left Eye Near:    Bilateral Near:     Physical Exam Constitutional:      Appearance: Normal appearance.  HENT:     Head: Normocephalic.     Right Ear: Tympanic membrane, ear canal and external ear normal.     Left Ear: Tympanic membrane, ear canal and external ear normal.     Nose: Congestion present.     Mouth/Throat:     Mouth: Mucous membranes are moist.     Pharynx: Oropharynx is clear. No oropharyngeal exudate or posterior oropharyngeal erythema.  Eyes:     Extraocular Movements: Extraocular movements intact.  Cardiovascular:     Rate and Rhythm: Normal rate and regular rhythm.     Pulses: Normal pulses.     Heart sounds: Normal heart sounds.  Pulmonary:     Effort: Pulmonary effort is normal.     Breath sounds: Normal breath sounds.  Neurological:     Mental Status: He is alert and oriented to person, place, and time. Mental status is at baseline.      UC Treatments / Results  Labs (all labs ordered are listed, but only abnormal results are displayed) Labs Reviewed  POC COVID19/FLU A&B COMBO - Abnormal; Notable for the following components:      Result Value   Influenza A Antigen, POC Positive (*)    All other components within normal limits    EKG   Radiology No results found.  Procedures Procedures (including critical care time)  Medications Ordered in UC Medications - No data to display  Initial Impression / Assessment and Plan /  UC Course  I have reviewed the triage vital signs and the nursing notes.  Pertinent labs & imaging results that were available during my care of the patient were reviewed by me and considered in my medical decision making (see chart for details).  Influenza A  Patient is in no signs of distress nor toxic appearing.  Vital signs are stable.  Low suspicion for pneumonia, pneumothorax or bronchitis and therefore will defer imaging.  Prescribed Tamiflu, prednisone and Atrovent nasal spray.May use additional over-the-counter medications as needed for supportive care.  May follow-up with urgent care as needed if symptoms persist or worsen.  Note given.   Final Clinical Impressions(s) / UC Diagnoses   Final diagnoses:  Influenza A     Discharge Instructions      Influenza  a virus and should steadily improve in time it can take up to 7 to 10 days before you truly start to see a turnaround however things will get better    Tamiflu every morning and every evening to reduce the amount of virus in the body which helps to tone down symptoms  For sinus pressure begin prednisone every morning with food as directed to reduce internal inflammation  You may use nasal spray every morning and every evening to clear out congestion from the sinuses You can take Tylenol and/or Ibuprofen as needed for fever reduction and pain relief.   For cough:  honey 1/2 to 1 teaspoon (you can dilute the honey in water or another fluid).  You can also use guaifenesin and dextromethorphan for cough. You can use a humidifier for chest congestion and cough.  If you don't have a humidifier, you can sit in the bathroom with the hot shower running.      For sore throat: try warm salt water gargles, cepacol lozenges, throat spray, warm tea or water with lemon/honey, popsicles or ice, or OTC cold relief medicine for throat discomfort.   For congestion: take a daily anti-histamine like Zyrtec, Claritin, and a oral decongestant,  such as pseudoephedrine.  You can also use Flonase 1-2 sprays in each nostril daily.   It is important to stay hydrated: drink plenty of fluids (water, gatorade/powerade/pedialyte, juices, or teas) to keep your throat moisturized and help further relieve irritation/discomfort.    ED Prescriptions     Medication Sig Dispense Auth. Provider   oseltamivir (TAMIFLU) 75 MG capsule Take 1 capsule (75 mg total) by mouth every 12 (twelve) hours. 10 capsule Jullia Mulligan R, NP   predniSONE (STERAPRED UNI-PAK 21 TAB) 10 MG (21) TBPK tablet Take by mouth daily. Take 6 tabs by mouth daily  for 1 days, then 5 tabs for 1 days, then 4 tabs for 1 days, then 3 tabs for 1 days, 2 tabs for 1 days, then 1 tab by mouth daily for 1 days 21 tablet Jamica Woodyard R, NP   ipratropium (ATROVENT) 0.03 % nasal spray Place 2 sprays into both nostrils every 12 (twelve) hours. 30 mL Valinda Hoar, NP      PDMP not reviewed this encounter.   Valinda Hoar, [place] 08/24/23 [phone]

## 2024-04-09 ENCOUNTER — Other Ambulatory Visit: Payer: Self-pay | Admitting: Urology

## 2024-04-09 ENCOUNTER — Encounter: Payer: Self-pay | Admitting: Urology

## 2024-04-09 DIAGNOSIS — N403 Nodular prostate with lower urinary tract symptoms: Secondary | ICD-10-CM

## 2024-04-09 DIAGNOSIS — R972 Elevated prostate specific antigen [PSA]: Secondary | ICD-10-CM

## 2024-05-09 ENCOUNTER — Other Ambulatory Visit

## 2024-05-11 ENCOUNTER — Ambulatory Visit
Admission: RE | Admit: 2024-05-11 | Discharge: 2024-05-11 | Disposition: A | Discharge status: Home / Self Care | Source: Ambulatory Visit | Attending: Urology | Admitting: Urology

## 2024-05-11 DIAGNOSIS — R972 Elevated prostate specific antigen [PSA]: Secondary | ICD-10-CM

## 2024-05-11 DIAGNOSIS — N403 Nodular prostate with lower urinary tract symptoms: Secondary | ICD-10-CM

## 2024-05-11 MED ORDER — GADOPICLENOL 0.5 MMOL/ML IV SOLN
10.0000 mL | Freq: Once | INTRAVENOUS | Status: AC | PRN
  Administered 2024-05-11: 10 mL via INTRAVENOUS

## 2024-05-28 ENCOUNTER — Other Ambulatory Visit

## 2024-08-10 NOTE — Progress Notes (Unsigned)
 "     Electrophysiology Clinic Note    Date:  08/11/2024  Patient ID:  Keawe Marcello Carley, DOB Nov 20, 1960, MRN [id number] PCP:  Claudene Rayfield HERO, MD  Cardiologist:  Deatrice Cage, MD  Electrophysiologist:  Fonda Kitty, MD  Electrophysiology APP:  Delvina Mizzell, NP     Discussed the use of AI scribe software for clinical note transcription with the patient, who gave verbal consent to proceed.   Patient Profile    Chief Complaint: bradycardia  History of Present Illness: Delron K Crenshaw is a 64 y.o. male with PMH notable for bradycardia, vasovagal syncope, HTN, HLD, T2DM, hypothyroid; seen today for Fonda Kitty, MD (Previously Dr. Cindie) for routine electrophysiology followup.  He last saw Dr. Cindie 03/2023 at which time he was doing well.  An ETT prior to that appointment showed good chronotropic response to exercise, so pacemaker was not recommended.  On follow-up today, he is doing well from a cardiac standpoint. His main complaint today is bilateral foot numbness and coldness. He is on metformin for T2DM and questions whether it makes his feet feel worse. He recently saw PCP for further mgmt/evaluation.  He denies dizziness, SOB, worsening fatigue, presyncope. He does get fatigued after activity, recalls doing housework earlier this week and had to take a rest after about 2 hours. He recently stopped working and admits to being less active then he used to be.  He checks his pulse regularly at home and it's always in the 40s at rest, rises with activity.   Of note, his brother recently had PPM implanted, he is unsure of specific diagnosis. He recalls his father also has low pulse.   Arrhythmia/Device History No specialty comments available.    ROS:  Please see the history of present illness. All other systems are reviewed and otherwise negative.    Physical Exam    VS:  BP 118/68 (BP Location: Left Arm, Patient Position: Sitting, Cuff Size: Normal)   Pulse (!) 43   Ht 6'  (1.829 m)   Wt 221 lb 3.2 oz (100.3 kg)   SpO2 97%   BMI 30.00 kg/m  BMI: Body mass index is 30 kg/m.           Wt Readings from Last 3 Encounters:  08/11/24 221 lb 3.2 oz (100.3 kg)  04/16/23 219 lb 9.6 oz (99.6 kg)  03/04/23 221 lb 9.6 oz (100.5 kg)     GEN- The patient is well appearing, alert and oriented x 3 today.   Lungs- Clear to ausculation bilaterally, normal work of breathing.  Heart- Regular, bradycradic rate and rhythm, no murmurs, rubs or gallops Extremities- No peripheral edema, warm, dry   Studies Reviewed   Previous EP, cardiology notes.    EKG is ordered. Personal review of EKG from today shows:    EKG Interpretation Date/Time:  Wednesday August 11 2024 08:59:32 EST Ventricular Rate:  43 PR Interval:  116 QRS Duration:  108 QT Interval:  454 QTC Calculation: 383 R Axis:   14  Text Interpretation: Marked sinus bradycardia Confirmed by Menashe Kafer [phone]) on 08/11/2024 9:04:39 AM    ETT, 03/21/2023 Final impression Unable to achieve target heart rate for ischemic workup, achieved 80% of maximum predicted heart rate Widening of QRS noted with right bundle branch pattern evident starting at 4 minutes exercise and extending through recovery Widening of QRS from 100 ms up to 160 ms, persisting through recovery  Long term monitor, 02/23/2020 Normal sinus rhythm with an average heart  rate of 52 bpm. Minimum heart rate was 35 bpm.  This happened at 3:55 am and presumably during sleep time. 2 short runs of SVT.  The longest lasted 13 beats. Rare PACs and rare PVCs.  Stress TTE, 09/27/2014 - Stress ECG conclusions: There were no stress arrhythmias or    conduction abnormalities. The stress ECG was negative for    ischemia.  - Staged echo: Normal echo stress  - Baseline: LV global systolic function was normal. The estimated    LV ejection fraction was 60%.   Assessment and Plan     #) bradycardia Continues to have asymptomatic bradycardia Has some  fatigue with multiple hours of activity, likely d/t deconditioning since retirement Continue to monitor for symptoms of presyncope, dizziness Avoid AVN blocking agents Recent thyroid  labs stable  #) vasovagal syncope No recent syncope    Current medicines are reviewed at length with the patient today.   The patient does not have concerns regarding his medicines.  The following changes were made today:  none  Labs/ tests ordered today include:  Orders Placed This Encounter  Procedures   EKG 12-Lead     Disposition: Follow up with Dr. Kennyth or EP APP in 12-18 months , sooner if needed   Signed, Chantal Needle, NP  08/11/24  9:37 AM  Electrophysiology [facility] "

## 2024-08-11 ENCOUNTER — Encounter: Payer: Self-pay | Admitting: Cardiology

## 2024-08-11 ENCOUNTER — Ambulatory Visit: Attending: Cardiology | Admitting: Cardiology

## 2024-08-11 VITALS — BP 118/68 | HR 43 | Ht 72.0 in | Wt 221.2 lb

## 2024-08-11 DIAGNOSIS — R001 Bradycardia, unspecified: Secondary | ICD-10-CM

## 2024-08-11 DIAGNOSIS — R55 Syncope and collapse: Secondary | ICD-10-CM

## 2024-08-11 NOTE — Patient Instructions (Signed)
 Medication Instructions:  Your physician recommends that you continue on your current medications as directed. Please refer to the Current Medication list given to you today.  *If you need a refill on your cardiac medications before your next appointment, please call your pharmacy*  Lab Work: No labs ordered today    Testing/Procedures: No test ordered today   Follow-Up: At [facility], you and your health needs are our priority.  As part of our continuing mission to provide you with exceptional heart care, our providers are all part of one team.  This team includes your primary Cardiologist (physician) and Advanced Practice Providers or APPs (Physician Assistants and Nurse Practitioners) who all work together to provide you with the care you need, when you need it.  Your next appointment:   12 to 18 month(s)  Provider:   Suzann Riddle, NP
